# Patient Record
Sex: Female | Born: 2005
Health system: Southern US, Community
[De-identification: ages and names within clinical notes are randomized; demographics above are authoritative.]

## PROBLEM LIST (undated history)

## (undated) ENCOUNTER — Ambulatory Visit

## (undated) DIAGNOSIS — H6690 Otitis media, unspecified, unspecified ear: Secondary | ICD-10-CM

## (undated) HISTORY — PX: WISDOM TOOTH EXTRACTION: SHX21

---
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
## 2023-01-28 DIAGNOSIS — Z3202 Encounter for pregnancy test, result negative: Secondary | ICD-10-CM | POA: Diagnosis not present

## 2023-01-28 DIAGNOSIS — Z114 Encounter for screening for human immunodeficiency virus [HIV]: Secondary | ICD-10-CM | POA: Diagnosis not present

## 2023-01-28 DIAGNOSIS — Z113 Encounter for screening for infections with a predominantly sexual mode of transmission: Secondary | ICD-10-CM | POA: Diagnosis not present

## 2023-03-29 ENCOUNTER — Ambulatory Visit
Admission: EM | Admit: 2023-03-29 | Discharge: 2023-03-29 | Disposition: A | Discharge status: Home / Self Care | Payer: Medicaid Other | Attending: Internal Medicine | Admitting: Internal Medicine

## 2023-03-29 DIAGNOSIS — B349 Viral infection, unspecified: Secondary | ICD-10-CM

## 2023-03-29 DIAGNOSIS — R059 Cough, unspecified: Secondary | ICD-10-CM | POA: Diagnosis present

## 2023-03-29 DIAGNOSIS — Z1152 Encounter for screening for COVID-19: Secondary | ICD-10-CM | POA: Insufficient documentation

## 2023-03-29 MED ORDER — PSEUDOEPHEDRINE HCL 60 MG PO TABS: 60.0000 mg | ORAL_TABLET | Freq: Three times a day (TID) | ORAL | 0 refills | Status: DC | PRN

## 2023-03-29 MED ORDER — PROMETHAZINE-DM 6.25-15 MG/5ML PO SYRP: 5.0000 mL | ORAL_SOLUTION | Freq: Three times a day (TID) | ORAL | 0 refills | Status: DC | PRN

## 2023-03-29 MED ORDER — CETIRIZINE HCL 10 MG PO TABS: 10.0000 mg | ORAL_TABLET | Freq: Every day | ORAL | 0 refills | Status: DC

## 2023-03-29 MED ORDER — IBUPROFEN 800 MG PO TABS: 800.0000 mg | ORAL_TABLET | Freq: Three times a day (TID) | ORAL | 0 refills | Status: DC

## 2023-03-29 NOTE — ED Triage Notes (Signed)
Pt reports dry cough, chest congestion, chest pain when coughing,on and off right lower quadrant abdominal pain since last night. Tylenol and Delsym gives some relief.

## 2023-03-29 NOTE — ED Provider Notes (Signed)
[facility]  Note:  This document was prepared using Conservation officer, historic buildings and may include unintentional dictation errors.  MRN: [id number] DOB: 12/15/2005  Subjective:   Morgan Mcguire is a 17 y.o. female presenting for 1 day history of acute onset persistent dry cough, chest congestion and chest pain when she coughs.  She did start having right-sided flank pain with her coughing as well.  Has had some drainage but not overt throat pain.  Has been using Tylenol and Delsym with some relief.  Patient's mother would like a COVID test.  No history of asthma.  No smoking of any kind including cigarettes, cigars, vaping, marijuana use.    No current facility-administered medications for this encounter.  Current Outpatient Medications:    acetaminophen (TYLENOL) 500 MG tablet, Take 500 mg by mouth every 6 (six) hours as needed., Disp: , Rfl:    Dextromethorphan HBr (DELSYM PO), Take by mouth., Disp: , Rfl:    fluconazole (DIFLUCAN) 150 MG tablet, Take 1 tablet (150 mg total) by mouth daily., Disp: 1 tablet, Rfl: 0   No Known Allergies  Past Medical History:  Diagnosis Date   Acute ear infection      Past Surgical History:  Procedure Laterality Date   WISDOM TOOTH EXTRACTION      History reviewed. No pertinent family history.  Social History   Tobacco Use   Smoking status: Never   Smokeless tobacco: Never  Vaping Use   Vaping status: Never Used  Substance Use Topics   Alcohol use: Never   Drug use: Never    ROS   Objective:   Vitals: BP 126/81 (BP Location: Right Arm)   Pulse 85   Temp 98.3 F (36.8 C) (Oral)   Resp 16   LMP  (Within Weeks) Comment: 1 week  SpO2 95%   Physical Exam Constitutional:      General: She is not in acute distress.    Appearance: Normal appearance. She is well-developed. She is not ill-appearing, toxic-appearing or diaphoretic.  HENT:     Head: Normocephalic and atraumatic.     Nose: Nose normal.      Mouth/Throat:     Mouth: Mucous membranes are moist.     Pharynx: No pharyngeal swelling, oropharyngeal exudate, posterior oropharyngeal erythema or uvula swelling.     Tonsils: No tonsillar exudate or tonsillar abscesses. 0 on the right. 0 on the left.  Eyes:     General: No scleral icterus.       Right eye: No discharge.        Left eye: No discharge.     Extraocular Movements: Extraocular movements intact.  Cardiovascular:     Rate and Rhythm: Normal rate and regular rhythm.     Heart sounds: Normal heart sounds. No murmur heard.    No friction rub. No gallop.  Pulmonary:     Effort: Pulmonary effort is normal. No respiratory distress.     Breath sounds: No stridor. No wheezing, rhonchi or rales.  Chest:     Chest wall: No tenderness.  Skin:    General: Skin is warm and dry.  Neurological:     General: No focal deficit present.     Mental Status: She is alert and oriented to person, place, and time.  Psychiatric:        Mood and Affect: Mood normal.        Behavior: Behavior normal.     Assessment and Plan :  PDMP not reviewed this encounter.  1. Acute viral syndrome    Deferred imaging given clear cardiopulmonary exam, hemodynamically stable vital signs. Does not meet [facility] criteria for strep testing.  Will manage for viral illness such as viral URI, viral syndrome, viral rhinitis, COVID-19. Recommended supportive care. Offered scripts for symptomatic relief. Testing is pending. Counseled patient on potential for adverse effects with medications prescribed/recommended today, ER and return-to-clinic precautions discussed, patient verbalized understanding.     Wallis Bamberg, [place] 03/29/23 1310

## 2023-03-29 NOTE — Discharge Instructions (Addendum)
We will notify you of your test results as they arrive and may take between about 24 hours.  I encourage you to sign up for MyChart if you have not already done so as this can be the easiest way for [place] to communicate results to you online or through a phone app.  Generally, we only contact you if it is a positive test result.  In the meantime, if you develop worsening symptoms including fever, chest pain, shortness of breath despite our current treatment plan then please report to the emergency room as this may be a sign of worsening status from possible viral infection.  Otherwise, we will manage this as a viral syndrome. For sore throat or cough try using a honey-based tea. Use 3 teaspoons of honey with juice squeezed from half lemon. Place shaved pieces of ginger into 1/2-1 cup of water and warm over stove top. Then mix the ingredients and repeat every 4 hours as needed. Please take ibuprofen '600mg'$  every 6 hours for aches and pains, fevers. Hydrate very well with at least 2 liters of water. Eat light meals such as soups to replenish electrolytes and soft fruits, veggies. Start an antihistamine like Zyrtec ('10mg'$  daily) for postnasal drainage, sinus congestion.  You can take this together with pseudoephedrine (Sudafed) at a dose of 60 mg 2-3 times a day as needed for the same kind of congestion.  Use the cough medications as needed.

## 2023-03-30 ENCOUNTER — Telehealth: Payer: Self-pay | Admitting: Urgent Care

## 2023-03-30 LAB — SARS CORONAVIRUS 2 (TAT 6-24 HRS): SARS Coronavirus 2: NEGATIVE

## 2023-03-30 NOTE — Telephone Encounter (Signed)
During the phone encounter with patient, patient's mother has concerns about the ongoing illness. Recommended the emergency room or [facility] where she can see another provider/physician. However, I am also willing to see her again in clinic. Will hold off on sending in a new prescription as patient's mother plans on bringing her back in.

## 2023-03-30 NOTE — Telephone Encounter (Signed)
Patient's mother called with concerns about cough syrup making patient drowsy. Will send in prescription for benzonatate capsules instead.

## 2023-04-18 ENCOUNTER — Ambulatory Visit (HOSPITAL_COMMUNITY)
Admission: EM | Admit: 2023-04-18 | Discharge: 2023-04-18 | Disposition: A | Discharge status: Home / Self Care | Payer: BC Managed Care – PPO | Attending: Internal Medicine | Admitting: Internal Medicine

## 2023-04-18 ENCOUNTER — Encounter (HOSPITAL_COMMUNITY): Payer: Self-pay | Admitting: Emergency Medicine

## 2023-04-18 DIAGNOSIS — Z9189 Other specified personal risk factors, not elsewhere classified: Secondary | ICD-10-CM | POA: Insufficient documentation

## 2023-04-18 DIAGNOSIS — N760 Acute vaginitis: Secondary | ICD-10-CM | POA: Insufficient documentation

## 2023-04-18 DIAGNOSIS — Z3202 Encounter for pregnancy test, result negative: Secondary | ICD-10-CM | POA: Diagnosis not present

## 2023-04-18 LAB — POCT URINE PREGNANCY: Preg Test, Ur: NEGATIVE

## 2023-04-18 MED ORDER — FLUCONAZOLE 150 MG PO TABS: 150.0000 mg | ORAL_TABLET | ORAL | 0 refills | Status: DC

## 2023-04-18 NOTE — ED Triage Notes (Signed)
Pt reports vaginal discomfort and discharge for a couple days. Requesting STD testing. Denies any known exposure.

## 2023-04-18 NOTE — Discharge Instructions (Addendum)
I suspect that your  symptoms are due to suspected yeast vaginitis.  Take medication as prescribed.  Your swab has been sent for testing, staff will call you in 2-3 days if you test positive for any other infections and will provide treatment at that time.  Return to urgent care as needed and follow-up with your primary care provider for further evaluation and management of your symptoms..  I hope you feel better!

## 2023-04-18 NOTE — ED Provider Notes (Signed)
[facility]    CSN: [id number] Arrival date & time: 04/18/23  1621      History   Chief Complaint Chief Complaint  Patient presents with   Vaginal Discharge    HPI Jemmah Vanwyhe is a 17 y.o. female.   Carlin Madero is a 17 y.o. female presenting for chief complaint of vaginal discharge that started a couple of days ago.  Vaginal discharge is both sick and then, stringy, white, and without odor.  She reports significant vaginal itching and irritation as well.  Recent new unprotected sexual partner, would like STD testing as well. No recent known exposures to STD, nausea, vomiting, diarrhea, abdominal pain, pelvic pain, urinary symptoms, rash, fever/chills, or recent antibiotic/steroid use.  Has not attempted treatment of symptoms PTA.  Last menstrual cycle April 02, 2023.   Vaginal Discharge   Past Medical History:  Diagnosis Date   Acute ear infection     There are no problems to display for this patient.   Past Surgical History:  Procedure Laterality Date   WISDOM TOOTH EXTRACTION      OB History   No obstetric history on file.      Home Medications    Prior to Admission medications   Medication Sig Start Date End Date Taking? Authorizing Provider  fluconazole (DIFLUCAN) 150 MG tablet Take 1 tablet (150 mg total) by mouth every 3 (three) days. 04/18/23  Yes Carlisle Beers, FNP  acetaminophen (TYLENOL) 500 MG tablet Take 500 mg by mouth every 6 (six) hours as needed.    [provider]  cetirizine (ZYRTEC ALLERGY) 10 MG tablet Take 1 tablet (10 mg total) by mouth daily. 03/29/23   Wallis Bamberg, PA-C  Dextromethorphan HBr (DELSYM PO) Take by mouth.    [provider]  ibuprofen (ADVIL) 800 MG tablet Take 1 tablet (800 mg total) by mouth 3 (three) times daily. 03/29/23   Wallis Bamberg, PA-C    Family History No family history on file.  Social History Social History   Tobacco Use   Smoking status: Never   Smokeless tobacco:  Never  Vaping Use   Vaping status: Never Used  Substance Use Topics   Alcohol use: Never   Drug use: Never     Allergies   Patient has no known allergies.   Review of Systems Review of Systems  Genitourinary:  Positive for vaginal discharge.  Per HPI   Physical Exam Triage Vital Signs ED Triage Vitals  Encounter Vitals Group     BP 04/18/23 1640 124/81     Systolic BP Percentile --      Diastolic BP Percentile --      Pulse Rate 04/18/23 1640 87     Resp 04/18/23 1640 15     Temp 04/18/23 1640 98.7 F (37.1 C)     Temp Source 04/18/23 1640 Oral     SpO2 04/18/23 1640 100 %     Weight --      Height --      Head Circumference --      Peak Flow --      Pain Score 04/18/23 1639 7     Pain Loc --      Pain Education --      Exclude from Growth Chart --    No data found.  Updated Vital Signs BP 124/81 (BP Location: Left Arm)   Pulse 87   Temp 98.7 F (37.1 C) (Oral)   Resp 15   LMP 04/02/2023 (  Exact Date)   SpO2 100%   Visual Acuity Right Eye Distance:   Left Eye Distance:   Bilateral Distance:    Right Eye Near:   Left Eye Near:    Bilateral Near:     Physical Exam Vitals and nursing note reviewed.  Constitutional:      Appearance: She is not ill-appearing or toxic-appearing.  HENT:     Head: Normocephalic and atraumatic.     Right Ear: Hearing and external ear normal.     Left Ear: Hearing and external ear normal.     Nose: Nose normal.     Mouth/Throat:     Lips: Pink.     Mouth: Mucous membranes are moist. No injury.     Tongue: No lesions. Tongue does not deviate from midline.     Palate: No mass and lesions.     Pharynx: Oropharynx is clear. Uvula midline. No pharyngeal swelling, oropharyngeal exudate, posterior oropharyngeal erythema or uvula swelling.     Tonsils: No tonsillar exudate or tonsillar abscesses.  Eyes:     General: Lids are normal. Vision grossly intact. Gaze aligned appropriately.     Extraocular Movements: Extraocular  movements intact.     Conjunctiva/sclera: Conjunctivae normal.  Pulmonary:     Effort: Pulmonary effort is normal.  Genitourinary:    Comments: Deferred. Musculoskeletal:     Cervical back: Neck supple.     Right lower leg: No edema.     Left lower leg: No edema.  Skin:    General: Skin is warm and dry.     Capillary Refill: Capillary refill takes less than 2 seconds.     Findings: No rash.  Neurological:     General: No focal deficit present.     Mental Status: She is alert and oriented to person, place, and time. Mental status is at baseline.     Cranial Nerves: No dysarthria or facial asymmetry.  Psychiatric:        Mood and Affect: Mood normal.        Speech: Speech normal.        Behavior: Behavior normal.        Thought Content: Thought content normal.        Judgment: Judgment normal.      UC Treatments / Results  Labs (all labs ordered are listed, but only abnormal results are displayed) Labs Reviewed  POCT URINE PREGNANCY  CERVICOVAGINAL ANCILLARY ONLY    EKG   Radiology No results found.  Procedures Procedures (including critical care time)  Medications Ordered in UC Medications - No data to display  Initial Impression / Assessment and Plan / UC Course  I have reviewed the triage vital signs and the nursing notes.  Pertinent labs & imaging results that were available during my care of the patient were reviewed by me and considered in my medical decision making (see chart for details).   1. Acute vaginitis, unprotected sex, negative pregnancy test STI labs pending, will notify patient of positive results and treat accordingly per protocol when labs result.  Patient declines HIV and syphilis testing today.   Patient to avoid sexual intercourse until screening testing comes back.   Education provided regarding safe sexual practices and patient encouraged to use protection to prevent spread of STIs.  Urine pregnancy test negative.  Counseled patient  on potential for adverse effects with medications prescribed/recommended today, strict ER and return-to-clinic precautions discussed, patient verbalized understanding.    Final Clinical Impressions(s) / UC Diagnoses  Final diagnoses:  Acute vaginitis  At risk for sexually transmitted infection due to unprotected sex  Negative pregnancy test     Discharge Instructions      I suspect that your  symptoms are due to suspected yeast vaginitis.  Take medication as prescribed.  Your swab has been sent for testing, staff will call you in 2-3 days if you test positive for any other infections and will provide treatment at that time.  Return to urgent care as needed and follow-up with your primary care provider for further evaluation and management of your symptoms..  I hope you feel better!       ED Prescriptions     Medication Sig Dispense Auth. Provider   fluconazole (DIFLUCAN) 150 MG tablet Take 1 tablet (150 mg total) by mouth every 3 (three) days. 2 tablet Carlisle Beers, FNP      PDMP not reviewed this encounter.   Carlisle Beers, [place] 04/18/23 1722

## 2023-04-19 LAB — CERVICOVAGINAL ANCILLARY ONLY
Bacterial Vaginitis (gardnerella): NEGATIVE
Candida Glabrata: NEGATIVE
Candida Vaginitis: POSITIVE — AB
Chlamydia: NEGATIVE
Comment: NEGATIVE
Comment: NEGATIVE
Comment: NEGATIVE
Comment: NEGATIVE
Comment: NEGATIVE
Comment: NORMAL
Neisseria Gonorrhea: NEGATIVE
Trichomonas: NEGATIVE

## 2023-04-20 ENCOUNTER — Telehealth: Payer: Self-pay | Admitting: Emergency Medicine

## 2023-04-20 NOTE — Telephone Encounter (Signed)
Opened in error

## 2023-05-15 ENCOUNTER — Ambulatory Visit
Admission: RE | Admit: 2023-05-15 | Discharge: 2023-05-15 | Disposition: A | Discharge status: Home / Self Care | Payer: BC Managed Care – PPO | Source: Ambulatory Visit | Attending: Internal Medicine

## 2023-05-15 VITALS — BP 129/82 | HR 64 | Temp 97.6°F | Resp 16

## 2023-05-15 DIAGNOSIS — N760 Acute vaginitis: Secondary | ICD-10-CM | POA: Diagnosis not present

## 2023-05-15 MED ORDER — FLUCONAZOLE 150 MG PO TABS: 150.0000 mg | ORAL_TABLET | ORAL | 0 refills | Status: DC

## 2023-05-15 NOTE — ED Provider Notes (Signed)
  [facility]  Note:  This document was prepared using Conservation officer, historic buildings and may include unintentional dictation errors.  MRN: [id number] DOB: 02-25-2006  Subjective:   Morgan Mcguire is a 17 y.o. female presenting for 3-day history of acute onset recurrent vaginal itching, irritation and discharge.  Symptoms started after she had her menstrual cycle.  Has a history of yeast infections.  Last episode was a month ago.  Would like an STI check as well as BV and yeast infection check.  Does not have a gynecologist.  No current facility-administered medications for this encounter.  Current Outpatient Medications:    acetaminophen (TYLENOL) 500 MG tablet, Take 500 mg by mouth every 6 (six) hours as needed., Disp: , Rfl:    cetirizine (ZYRTEC ALLERGY) 10 MG tablet, Take 1 tablet (10 mg total) by mouth daily., Disp: 30 tablet, Rfl: 0   Dextromethorphan HBr (DELSYM PO), Take by mouth., Disp: , Rfl:    fluconazole (DIFLUCAN) 150 MG tablet, Take 1 tablet (150 mg total) by mouth every 3 (three) days., Disp: 2 tablet, Rfl: 0   ibuprofen (ADVIL) 800 MG tablet, Take 1 tablet (800 mg total) by mouth 3 (three) times daily., Disp: 21 tablet, Rfl: 0   No Known Allergies  Past Medical History:  Diagnosis Date   Acute ear infection      Past Surgical History:  Procedure Laterality Date   WISDOM TOOTH EXTRACTION      No family history on file.  Social History   Tobacco Use   Smoking status: Never   Smokeless tobacco: Never  Vaping Use   Vaping status: Never Used  Substance Use Topics   Alcohol use: Never   Drug use: Never    ROS   Objective:   Vitals: BP 129/82 (BP Location: Right Arm)   Pulse 64   Temp 97.6 F (36.4 C) (Oral)   Resp 16   LMP 05/12/2023 (Exact Date)   SpO2 99%   Physical Exam Constitutional:      General: She is not in acute distress.    Appearance: Normal appearance. She is well-developed. She is not ill-appearing,  toxic-appearing or diaphoretic.  HENT:     Head: Normocephalic and atraumatic.     Nose: Nose normal.     Mouth/Throat:     Mouth: Mucous membranes are moist.  Eyes:     General: No scleral icterus.       Right eye: No discharge.        Left eye: No discharge.     Extraocular Movements: Extraocular movements intact.  Cardiovascular:     Rate and Rhythm: Normal rate.  Pulmonary:     Effort: Pulmonary effort is normal.  Skin:    General: Skin is warm and dry.  Neurological:     General: No focal deficit present.     Mental Status: She is alert and oriented to person, place, and time.  Psychiatric:        Mood and Affect: Mood normal.        Behavior: Behavior normal.     Assessment and Plan :   PDMP not reviewed this encounter.  1. Acute vaginitis    We will treat patient empirically for yeast vaginitis with fluconazole.  Labs pending. Counseled patient on potential for adverse effects with medications prescribed/recommended today, ER and return-to-clinic precautions discussed, patient verbalized understanding.    Wallis Bamberg, PA-C 05/15/23 1128

## 2023-05-15 NOTE — ED Triage Notes (Signed)
Pt c/o vaginal itching x 3 days-sx started after menstrual cycle start-NAD-steady gait

## 2023-05-17 LAB — CERVICOVAGINAL ANCILLARY ONLY
Bacterial Vaginitis (gardnerella): NEGATIVE
Candida Glabrata: NEGATIVE
Candida Vaginitis: POSITIVE — AB
Chlamydia: NEGATIVE
Comment: NEGATIVE
Comment: NEGATIVE
Comment: NEGATIVE
Comment: NEGATIVE
Comment: NEGATIVE
Comment: NORMAL
Neisseria Gonorrhea: NEGATIVE
Trichomonas: NEGATIVE

## 2023-05-18 DIAGNOSIS — N760 Acute vaginitis: Secondary | ICD-10-CM | POA: Diagnosis not present

## 2023-05-18 DIAGNOSIS — N898 Other specified noninflammatory disorders of vagina: Secondary | ICD-10-CM | POA: Diagnosis not present

## 2023-06-03 ENCOUNTER — Ambulatory Visit: Payer: BC Managed Care – PPO | Admitting: Family Medicine

## 2023-06-03 ENCOUNTER — Encounter: Payer: Self-pay | Admitting: Family Medicine

## 2023-06-03 VITALS — BP 120/80 | HR 61 | Temp 97.5°F | Resp 16 | Ht 68.5 in | Wt 183.4 lb

## 2023-06-03 DIAGNOSIS — R1013 Epigastric pain: Secondary | ICD-10-CM | POA: Diagnosis not present

## 2023-06-03 LAB — POC URINALSYSI DIPSTICK (AUTOMATED)
Bilirubin, UA: NEGATIVE
Blood, UA: NEGATIVE
Glucose, UA: NEGATIVE
Leukocytes, UA: NEGATIVE
Nitrite, UA: NEGATIVE
Protein, UA: NEGATIVE
Spec Grav, UA: >=1.03 — AB (ref 1.010–1.025)
Urobilinogen, UA: 0.2 U/dL
pH, UA: 6 (ref 5.0–8.0)

## 2023-06-03 LAB — POCT URINE PREGNANCY: Preg Test, Ur: NEGATIVE

## 2023-06-03 MED ORDER — OMEPRAZOLE 40 MG PO CPDR: 40.0000 mg | DELAYED_RELEASE_CAPSULE | Freq: Every day | ORAL | 3 refills | Status: DC

## 2023-06-03 NOTE — Patient Instructions (Signed)
Abdominal Pain, Adult  Pain in the abdomen (abdominal pain) can be caused by many things. In most cases, it gets better with no treatment or by being treated at home. But in some cases, it can be serious. Your health care provider will ask questions about your medical history and do a physical exam to try to figure out what is causing your pain. Follow these instructions at home: Medicines Take over-the-counter and prescription medicines only as told by your provider. Do not take medicines that help you poop (laxatives) unless told by your provider. General instructions Watch your condition for any changes. Drink enough fluid to keep your pee (urine) pale yellow. Contact a health care provider if: Your pain changes, gets worse, or lasts longer than expected. You have severe cramping or bloating in your abdomen, or you vomit. Your pain gets worse with meals, after eating, or with certain foods. You are constipated or have diarrhea for more than 2-3 days. You are not hungry, or you lose weight without trying. You have signs of dehydration. These may include: Dark pee, very little pee, or no pee. Cracked lips or dry mouth. Sleepiness or weakness. You have pain when you pee (urinate) or poop. Your abdominal pain wakes you up at night. You have blood in your pee. You have a fever. Get help right away if: You cannot stop vomiting. Your pain is only in one part of the abdomen. Pain on the right side could be caused by appendicitis. You have bloody or black poop (stool), or poop that looks like tar. You have trouble breathing. You have chest pain. These symptoms may be an emergency. Get help right away. Call 911. Do not wait to see if the symptoms will go away. Do not drive yourself to the hospital. This information is not intended to replace advice given to you by your health care provider. Make sure you discuss any questions you have with your health care provider. Document Revised:  05/06/2022 Document Reviewed: 05/06/2022 Elsevier Patient Education  2024 [employer]  

## 2023-06-03 NOTE — Progress Notes (Signed)
Established Patient Office Visit  Subjective   Patient ID: Morgan Mcguire, female    DOB: 05-08-2006  Age: 17 y.o. MRN: [id number]  Chief Complaint  Patient presents with   Establish Care    HPI   .Discussed the use of AI scribe software for clinical note transcription with the patient, who gave verbal consent to proceed.  History of Present Illness   The patient, a young adult, presents for establishing care after aging out of pediatric services. She reports a history of stomach pain, described as a constant feeling in the upper abdomen, which is triggered by certain foods. The patient is unable to identify a specific food group that triggers the pain, but notes that it could be fried foods or even mac and cheese. The pain has been ongoing for a while and appears to be worsening. She has been taking omeprazole, prescribed by her workplace, an urgent care center, which provides some relief.  In addition, the patient has a history of recurrent yeast infections, which seem to coincide with her menstrual cycle. She has been to the emergency room multiple times for this issue, and all tests for STIs and STDs have returned negative. The patient has been prescribed Diflucan and metronidazole for these infections. She is currently under the care of an OBGYN for this issue.  The patient is sexually active with one female partner and uses condoms for birth control. She denies any use of alcohol, drugs, or tobacco. 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       Patient Active Problem List   Diagnosis Date Noted   Epigastric pain 06/03/2023   Past Medical History:  Diagnosis Date   Acute ear infection    Past Surgical History:  Procedure Laterality Date   WISDOM TOOTH EXTRACTION     Social History   Tobacco Use   Smoking status: Never   Smokeless tobacco: Never  Vaping Use   Vaping status: Never Used   Substance Use Topics   Alcohol use: Never   Drug use: Never   Social History   Socioeconomic History   Marital status: Single    Spouse name: Not on file   Number of children: Not on file   Years of education: Not on file   Highest education level: Not on file  Occupational History   Occupation: front desk reception    Comment: [employer]   Occupation: student a and [employer]  Tobacco Use   Smoking status: Never   Smokeless tobacco: Never  Vaping Use   Vaping status: Never Used  Substance and Sexual Activity   Alcohol use: Never   Drug use: Never   Sexual activity: Yes    Partners: Male    Birth control/protection: None, Condom  Other Topics Concern   Not on file  Social History Narrative   Lives at home with mom   No pets    House    Social Determinants of Health   Financial Resource Strain: Not on file  Food Insecurity: Not on file  Transportation Needs: Not on file  Physical Activity: Not on file  Stress: Not on file  Social Connections: Not on file  Intimate Partner Violence: Not on file   Family Status  Relation Name Status   Mother  Alive   Father  Alive   Sister  Alive   Brother  Alive  No partnership data  on file   No family history on file. No Known Allergies    Review of Systems  Constitutional:  Negative for fever and malaise/fatigue.  HENT:  Negative for congestion.   Eyes:  Negative for blurred vision.  Respiratory:  Negative for shortness of breath.   Cardiovascular:  Negative for chest pain, palpitations and leg swelling.  Gastrointestinal:  Positive for abdominal pain. Negative for blood in stool, constipation, diarrhea, nausea and vomiting.  Genitourinary:  Negative for dysuria and frequency.  Musculoskeletal:  Negative for falls.  Skin:  Negative for rash.  Neurological:  Negative for dizziness, loss of consciousness and headaches.  Endo/Heme/Allergies:  Negative for environmental allergies.  Psychiatric/Behavioral:  Negative for  depression. The patient is not nervous/anxious.       Objective:     BP 120/80 (BP Location: Left Arm, Patient Position: Sitting, Cuff Size: Normal)   Pulse 61   Temp (!) 97.5 F (36.4 C) (Oral)   Resp 16   Ht 5' 8.5" (1.74 m)   Wt 183 lb 6.4 oz (83.2 kg)   LMP 05/12/2023 (Exact Date)   SpO2 100%   BMI 27.48 kg/m  BP Readings from Last 3 Encounters:  06/03/23 120/80  05/15/23 129/82  04/18/23 124/81   Wt Readings from Last 3 Encounters:  06/03/23 183 lb 6.4 oz (83.2 kg) (95%, Z= 1.69)*  11/19/22 213 lb (96.6 kg) (98%, Z= 2.12)*  05/18/22 200 lb (90.7 kg) (98%, Z= 1.98)*   * Growth percentiles are based on CDC (Girls, 2-20 Years) data.   SpO2 Readings from Last 3 Encounters:  06/03/23 100%  05/15/23 99%  04/18/23 100%      Physical Exam Vitals and nursing note reviewed.  Constitutional:      General: She is not in acute distress.    Appearance: Normal appearance. She is well-developed.  HENT:     Head: Normocephalic and atraumatic.     Right Ear: Tympanic membrane, ear canal and external ear normal. There is no impacted cerumen.     Left Ear: Tympanic membrane, ear canal and external ear normal. There is no impacted cerumen.     Nose: Nose normal.     Mouth/Throat:     Mouth: Mucous membranes are moist.     Pharynx: Oropharynx is clear. No oropharyngeal exudate or posterior oropharyngeal erythema.  Eyes:     General: No scleral icterus.       Right eye: No discharge.        Left eye: No discharge.     Conjunctiva/sclera: Conjunctivae normal.     Pupils: Pupils are equal, round, and reactive to light.  Neck:     Thyroid: No thyromegaly or thyroid tenderness.     Vascular: No JVD.  Cardiovascular:     Rate and Rhythm: Normal rate and regular rhythm.     Heart sounds: Normal heart sounds. No murmur heard. Pulmonary:     Effort: Pulmonary effort is normal. No respiratory distress.     Breath sounds: Normal breath sounds.  Abdominal:     General: Bowel  sounds are normal. There is no distension.     Palpations: Abdomen is soft. There is no mass.     Tenderness: There is abdominal tenderness. There is no guarding or rebound.       Comments: Min tenderness mid epigastric and periumbilical with palpation No rebound/ guarding   Genitourinary:    Vagina: Normal.  Musculoskeletal:        General: Normal range of  motion.     Cervical back: Normal range of motion and neck supple.     Right lower leg: No edema.     Left lower leg: No edema.  Lymphadenopathy:     Cervical: No cervical adenopathy.  Skin:    General: Skin is warm and dry.     Findings: No erythema or rash.  Neurological:     Mental Status: She is alert and oriented to person, place, and time.     Cranial Nerves: No cranial nerve deficit.     Deep Tendon Reflexes: Reflexes are normal and symmetric.  Psychiatric:        Mood and Affect: Mood normal.        Behavior: Behavior normal.        Thought Content: Thought content normal.        Judgment: Judgment normal.      Results for orders placed or performed in visit on 06/03/23  POCT Urinalysis Dipstick (Automated)  Result Value Ref Range   Color, UA yellow    Clarity, UA clear    Glucose, UA Negative Negative   Bilirubin, UA neg    Ketones, UA trace    Spec Grav, UA >=1.030 (A) 1.010 - 1.025   Blood, UA neg    pH, UA 6.0 5.0 - 8.0   Protein, UA Negative Negative   Urobilinogen, UA 0.2 0.2 or 1.0 E.U./dL   Nitrite, UA neg    Leukocytes, UA Negative Negative  POCT urine pregnancy  Result Value Ref Range   Preg Test, Ur Negative Negative    Last CBC No results found for: "WBC", "HGB", "HCT", "MCV", "MCH", "RDW", "PLT" Last metabolic panel No results found for: "GLUCOSE", "NA", "K", "CL", "CO2", "BUN", "CREATININE", "EGFR", "CALCIUM", "PHOS", "PROT", "ALBUMIN", "LABGLOB", "AGRATIO", "BILITOT", "ALKPHOS", "AST", "ALT", "ANIONGAP" Last lipids No results found for: "CHOL", "HDL", "LDLCALC", "LDLDIRECT", "TRIG",  "CHOLHDL" Last hemoglobin A1c No results found for: "HGBA1C" Last thyroid functions No results found for: "TSH", "T3TOTAL", "T4TOTAL", "THYROIDAB" Last vitamin D No results found for: "25OHVITD2", "25OHVITD3", "VD25OH" Last vitamin B12 and Folate No results found for: "VITAMINB12", "FOLATE"    The ASCVD Risk score (Arnett DK, et al., 2019) failed to calculate for the following reasons:   The 2019 ASCVD risk score is only valid for ages 30 to 36    Assessment & Plan:   Problem List Items Addressed This Visit       Unprioritized   Epigastric pain - Primary   Relevant Medications   omeprazole (PRILOSEC) 40 MG capsule   Other Relevant Orders   Comprehensive metabolic panel   CBC with Differential/Platelet   POCT Urinalysis Dipstick (Automated) (Completed)   POCT urine pregnancy (Completed)   US Abdomen Complete  Assessment and Plan    Abdominal Pain Epigastric pain after eating certain foods, possibly related to fatty or fried foods. No associated diarrhea or constipation. Omeprazole provides some relief. -Continue Omeprazole daily for 6-8 weeks. -Order abdominal ultrasound to evaluate gallbladder and other abdominal structures. -If symptoms persist after stopping Omeprazole, refer to gastroenterologist.  Recurrent Yeast Infections Occurring around menstrual cycle, confirmed by cultures. No other STIs identified. -Managed by OBGYN at [facility].  General Health Maintenance -Declined flu shot at this visit.        Return if symptoms worsen or fail to improve.    Donato Schultz, DO

## 2023-06-04 ENCOUNTER — Telehealth (HOSPITAL_BASED_OUTPATIENT_CLINIC_OR_DEPARTMENT_OTHER): Payer: Self-pay

## 2023-06-04 LAB — COMPREHENSIVE METABOLIC PANEL
ALT: 11 U/L (ref 0–35)
AST: 12 U/L (ref 0–37)
Albumin: 3.9 g/dL (ref 3.5–5.2)
Alkaline Phosphatase: 53 U/L (ref 47–119)
BUN: 9 mg/dL (ref 6–23)
CO2: 27 meq/L (ref 19–32)
Calcium: 9.1 mg/dL (ref 8.4–10.5)
Chloride: 104 meq/L (ref 96–112)
Creatinine, Ser: 0.84 mg/dL (ref 0.40–1.20)
GFR: 100.91 mL/min (ref >60.00)
Glucose, Bld: 74 mg/dL (ref 70–99)
Potassium: 4 meq/L (ref 3.5–5.1)
Sodium: 139 meq/L (ref 135–145)
Total Bilirubin: 0.3 mg/dL (ref 0.2–1.2)
Total Protein: 6.5 g/dL (ref 6.0–8.3)

## 2023-06-04 LAB — CBC WITH DIFFERENTIAL/PLATELET
Basophils Absolute: 0.1 10*3/uL (ref 0.0–0.1)
Basophils Relative: 0.8 % (ref 0.0–3.0)
Eosinophils Absolute: 0.1 10*3/uL (ref 0.0–0.7)
Eosinophils Relative: 1.5 % (ref 0.0–5.0)
HCT: 38.3 % (ref 36.0–49.0)
Hemoglobin: 12 g/dL (ref 12.0–16.0)
Lymphocytes Relative: 32.8 % (ref 24.0–48.0)
Lymphs Abs: 2.7 10*3/uL (ref 0.7–4.0)
MCHC: 31.3 g/dL (ref 31.0–37.0)
MCV: 83.3 fL (ref 78.0–98.0)
Monocytes Absolute: 0.9 10*3/uL (ref 0.1–1.0)
Monocytes Relative: 10.9 % (ref 3.0–12.0)
Neutro Abs: 4.4 10*3/uL (ref 1.4–7.7)
Neutrophils Relative %: 54 % (ref 43.0–71.0)
Platelets: 112 10*3/uL — ABNORMAL LOW (ref 150.0–575.0)
RBC: 4.6 Mil/uL (ref 3.80–5.70)
RDW: 14.9 % (ref 11.4–15.5)
WBC: 8.2 10*3/uL (ref 4.5–13.5)

## 2023-06-06 ENCOUNTER — Other Ambulatory Visit: Payer: Self-pay | Admitting: Family Medicine

## 2023-06-06 DIAGNOSIS — D691 Qualitative platelet defects: Secondary | ICD-10-CM

## 2023-06-07 ENCOUNTER — Other Ambulatory Visit: Payer: BC Managed Care – PPO

## 2023-06-07 ENCOUNTER — Other Ambulatory Visit (INDEPENDENT_AMBULATORY_CARE_PROVIDER_SITE_OTHER): Payer: BC Managed Care – PPO

## 2023-06-07 ENCOUNTER — Telehealth: Payer: Self-pay | Admitting: Family Medicine

## 2023-06-07 ENCOUNTER — Ambulatory Visit (HOSPITAL_BASED_OUTPATIENT_CLINIC_OR_DEPARTMENT_OTHER)
Admission: RE | Admit: 2023-06-07 | Discharge: 2023-06-07 | Disposition: A | Discharge status: Home / Self Care | Payer: BC Managed Care – PPO | Source: Ambulatory Visit | Attending: Family Medicine | Admitting: Family Medicine

## 2023-06-07 DIAGNOSIS — R10816 Epigastric abdominal tenderness: Secondary | ICD-10-CM | POA: Diagnosis not present

## 2023-06-07 DIAGNOSIS — R1013 Epigastric pain: Secondary | ICD-10-CM | POA: Diagnosis not present

## 2023-06-07 DIAGNOSIS — D691 Qualitative platelet defects: Secondary | ICD-10-CM | POA: Diagnosis not present

## 2023-06-07 NOTE — Addendum Note (Signed)
Addended by: Thelma Barge D on: 06/07/2023 02:49 PM   Modules accepted: Orders

## 2023-06-07 NOTE — Telephone Encounter (Signed)
Pt's mom called regarding her lab notes. States they read on mychart her platelets were low and noted pt has also been experiencing stomach pain and wondered if this is correlated. She would like to know what to going forward. Pt is scheduled to repeat labs tomorrow but her mother is requesting imaging to be done. Please follow up with pt.

## 2023-06-08 ENCOUNTER — Other Ambulatory Visit: Payer: Self-pay | Admitting: Family Medicine

## 2023-06-08 ENCOUNTER — Other Ambulatory Visit: Payer: BC Managed Care – PPO

## 2023-06-08 DIAGNOSIS — R1013 Epigastric pain: Secondary | ICD-10-CM

## 2023-06-08 LAB — COMPREHENSIVE METABOLIC PANEL
ALT: 9 U/L (ref 0–35)
AST: 9 U/L (ref 0–37)
Albumin: 3.7 g/dL (ref 3.5–5.2)
Alkaline Phosphatase: 43 U/L — ABNORMAL LOW (ref 47–119)
BUN: 5 mg/dL — ABNORMAL LOW (ref 6–23)
CO2: 27 meq/L (ref 19–32)
Calcium: 9.2 mg/dL (ref 8.4–10.5)
Chloride: 106 meq/L (ref 96–112)
Creatinine, Ser: 0.84 mg/dL (ref 0.40–1.20)
GFR: 100.9 mL/min (ref >60.00)
Glucose, Bld: 87 mg/dL (ref 70–99)
Potassium: 4.4 meq/L (ref 3.5–5.1)
Sodium: 138 meq/L (ref 135–145)
Total Bilirubin: 0.5 mg/dL (ref 0.2–1.2)
Total Protein: 6.2 g/dL (ref 6.0–8.3)

## 2023-06-08 LAB — CBC WITH DIFFERENTIAL/PLATELET
Basophils Absolute: 0.1 10*3/uL (ref 0.0–0.1)
Basophils Relative: 0.8 % (ref 0.0–3.0)
Eosinophils Absolute: 0.1 10*3/uL (ref 0.0–0.7)
Eosinophils Relative: 2.1 % (ref 0.0–5.0)
HCT: 37.7 % (ref 36.0–49.0)
Hemoglobin: 11.9 g/dL — ABNORMAL LOW (ref 12.0–16.0)
Lymphocytes Relative: 35.4 % (ref 24.0–48.0)
Lymphs Abs: 2.2 10*3/uL (ref 0.7–4.0)
MCHC: 31.5 g/dL (ref 31.0–37.0)
MCV: 83.5 fL (ref 78.0–98.0)
Monocytes Absolute: 0.6 10*3/uL (ref 0.1–1.0)
Monocytes Relative: 8.9 % (ref 3.0–12.0)
Neutro Abs: 3.3 10*3/uL (ref 1.4–7.7)
Neutrophils Relative %: 52.8 % (ref 43.0–71.0)
Platelets: 150 10*3/uL (ref 150.0–575.0)
RBC: 4.52 Mil/uL (ref 3.80–5.70)
RDW: 15.2 % (ref 11.4–15.5)
WBC: 6.3 10*3/uL (ref 4.5–13.5)

## 2023-06-08 NOTE — Telephone Encounter (Signed)
Noted. Imaging has not been read yet

## 2023-07-12 ENCOUNTER — Other Ambulatory Visit: Payer: Self-pay

## 2023-07-12 ENCOUNTER — Encounter (HOSPITAL_BASED_OUTPATIENT_CLINIC_OR_DEPARTMENT_OTHER): Payer: Self-pay

## 2023-07-12 ENCOUNTER — Emergency Department (HOSPITAL_BASED_OUTPATIENT_CLINIC_OR_DEPARTMENT_OTHER)
Admission: EM | Admit: 2023-07-12 | Discharge: 2023-07-13 | Disposition: A | Discharge status: Home / Self Care | Payer: BC Managed Care – PPO | Attending: Emergency Medicine | Admitting: Emergency Medicine

## 2023-07-12 ENCOUNTER — Emergency Department (HOSPITAL_BASED_OUTPATIENT_CLINIC_OR_DEPARTMENT_OTHER): Payer: BC Managed Care – PPO

## 2023-07-12 DIAGNOSIS — R0789 Other chest pain: Secondary | ICD-10-CM | POA: Diagnosis not present

## 2023-07-12 DIAGNOSIS — B349 Viral infection, unspecified: Secondary | ICD-10-CM | POA: Diagnosis not present

## 2023-07-12 DIAGNOSIS — Z20822 Contact with and (suspected) exposure to covid-19: Secondary | ICD-10-CM | POA: Diagnosis not present

## 2023-07-12 DIAGNOSIS — R059 Cough, unspecified: Secondary | ICD-10-CM | POA: Diagnosis not present

## 2023-07-12 DIAGNOSIS — R079 Chest pain, unspecified: Secondary | ICD-10-CM | POA: Diagnosis not present

## 2023-07-12 LAB — BASIC METABOLIC PANEL
Anion gap: 7 (ref 5–15)
BUN: 11 mg/dL (ref 6–20)
CO2: 24 mmol/L (ref 22–32)
Calcium: 9.3 mg/dL (ref 8.9–10.3)
Chloride: 105 mmol/L (ref 98–111)
Creatinine, Ser: 1 mg/dL (ref 0.44–1.00)
GFR, Estimated: >60 mL/min (ref >60)
Glucose, Bld: 93 mg/dL (ref 70–99)
Potassium: 3.4 mmol/L — ABNORMAL LOW (ref 3.5–5.1)
Sodium: 136 mmol/L (ref 135–145)

## 2023-07-12 LAB — CBC
HCT: 39.3 % (ref 36.0–46.0)
Hemoglobin: 12.7 g/dL (ref 12.0–15.0)
MCH: 26.5 pg (ref 26.0–34.0)
MCHC: 32.3 g/dL (ref 30.0–36.0)
MCV: 81.9 fL (ref 80.0–100.0)
Platelets: 119 10*3/uL — ABNORMAL LOW (ref 150–400)
RBC: 4.8 MIL/uL (ref 3.87–5.11)
RDW: 14.1 % (ref 11.5–15.5)
WBC: 5.7 10*3/uL (ref 4.0–10.5)
nRBC: 0 % (ref 0.0–0.2)

## 2023-07-12 LAB — TROPONIN I (HIGH SENSITIVITY): Troponin I (High Sensitivity): <2 ng/L (ref <18)

## 2023-07-12 NOTE — ED Triage Notes (Signed)
Pt arrives with c/o chest tightness, cough, body aches, and headaches. Pts coworker did test positive for covid. Pt denies fevers.

## 2023-07-13 ENCOUNTER — Other Ambulatory Visit: Payer: Self-pay

## 2023-07-13 DIAGNOSIS — B349 Viral infection, unspecified: Secondary | ICD-10-CM | POA: Diagnosis not present

## 2023-07-13 LAB — RESP PANEL BY RT-PCR (RSV, FLU A&B, COVID)  RVPGX2
Influenza A by PCR: NEGATIVE
Influenza B by PCR: NEGATIVE
Resp Syncytial Virus by PCR: NEGATIVE
SARS Coronavirus 2 by RT PCR: NEGATIVE

## 2023-07-13 MED ORDER — ACETAMINOPHEN 500 MG PO TABS
1000.0000 mg | ORAL_TABLET | Freq: Once | ORAL | Status: AC
  Administered 2023-07-13: 1000 mg via ORAL
  Filled 2023-07-13: qty 2

## 2023-07-13 NOTE — ED Notes (Signed)
Pt and mother aware we still need urine

## 2023-07-13 NOTE — ED Notes (Signed)
Provider at bedside

## 2023-07-13 NOTE — ED Provider Notes (Signed)
[place] [facility] Provider Note  CSN: [id number] Arrival date & time: 07/12/23 2241  Chief Complaint(s) Fatigue, myalgia, and chest discomfort  HPI Morgan Mcguire is a 17 y.o. female    The history is provided by the patient.  Influenza Presenting symptoms: cough, fatigue, headache, myalgias and rhinorrhea   Presenting symptoms: no shortness of breath and no vomiting   Severity:  Moderate Onset quality:  Gradual Duration:  2 days Progression:  Worsening Chronicity:  New Worsened by:  Nothing Ineffective treatments:  None tried Associated symptoms: chills and nasal congestion   Associated symptoms: no neck stiffness   Risk factors: sick contacts    Patient reported anterior upper chest discomfort today. Worse with coughing.  Past Medical History Past Medical History:  Diagnosis Date   Acute ear infection    Patient Active Problem List   Diagnosis Date Noted   Epigastric pain 06/03/2023   Home Medication(s) Prior to Admission medications   Medication Sig Start Date End Date Taking? Authorizing Provider  acetaminophen (TYLENOL) 500 MG tablet Take 500 mg by mouth every 6 (six) hours as needed.    [provider]  cetirizine (ZYRTEC ALLERGY) 10 MG tablet Take 1 tablet (10 mg total) by mouth daily. 03/29/23   Wallis Bamberg, PA-C  ibuprofen (ADVIL) 800 MG tablet Take 1 tablet (800 mg total) by mouth 3 (three) times daily. 03/29/23   Wallis Bamberg, PA-C  omeprazole (PRILOSEC) 40 MG capsule Take 1 capsule (40 mg total) by mouth daily. 06/03/23   Donato Schultz, DO                                                                                                                                    Allergies Patient has no known allergies.  Review of Systems Review of Systems  Constitutional:  Positive for chills and fatigue.  HENT:  Positive for congestion and rhinorrhea.   Respiratory:  Positive for cough. Negative for shortness  of breath.   Gastrointestinal:  Negative for vomiting.  Musculoskeletal:  Positive for myalgias. Negative for neck stiffness.  Neurological:  Positive for headaches.   As noted in HPI  Physical Exam Vital Signs  I have reviewed the triage vital signs BP (!) 143/99   Pulse 71   Temp 98.9 F (37.2 C)   Resp 18   Ht 5\' 8"  (1.727 m)   Wt 81.6 kg   LMP 07/05/2023   SpO2 99%   BMI 27.37 kg/m   Physical Exam Vitals reviewed.  Constitutional:      General: She is not in acute distress.    Appearance: She is well-developed. She is not diaphoretic.  HENT:     Head: Normocephalic and atraumatic.     Right Ear: Tympanic membrane normal.     Left Ear: Tympanic membrane normal.     Nose: Congestion present.     Mouth/Throat:  Tongue: No lesions.     Pharynx: Postnasal drip present. No posterior oropharyngeal erythema.     Tonsils: No tonsillar exudate.  Eyes:     General: No scleral icterus.       Right eye: No discharge.        Left eye: No discharge.     Conjunctiva/sclera: Conjunctivae normal.     Pupils: Pupils are equal, round, and reactive to light.  Cardiovascular:     Rate and Rhythm: Normal rate and regular rhythm.     Heart sounds: No murmur heard.    No friction rub. No gallop.  Pulmonary:     Effort: Pulmonary effort is normal. No respiratory distress.     Breath sounds: Normal breath sounds. No stridor. No rales.  Chest:     Chest wall: Tenderness present.    Abdominal:     General: There is no distension.     Palpations: Abdomen is soft.     Tenderness: There is no abdominal tenderness.  Musculoskeletal:        General: No tenderness.     Cervical back: Normal range of motion and neck supple.  Skin:    General: Skin is warm and dry.     Findings: No erythema or rash.  Neurological:     Mental Status: She is alert and oriented to person, place, and time.     ED Results and Treatments Labs (all labs ordered are listed, but only abnormal results  are displayed) Labs Reviewed  BASIC METABOLIC PANEL - Abnormal; Notable for the following components:      Result Value   Potassium 3.4 (*)    All other components within normal limits  CBC - Abnormal; Notable for the following components:   Platelets 119 (*)    All other components within normal limits  RESP PANEL BY RT-PCR (RSV, FLU A&B, COVID)  RVPGX2  PREGNANCY, URINE  TROPONIN I (HIGH SENSITIVITY)  TROPONIN I (HIGH SENSITIVITY)                                                                                                                         EKG   Radiology DG Chest 2 View  Result Date: 07/12/2023 CLINICAL DATA:  Chest pain. EXAM: CHEST - 2 VIEW COMPARISON:  Chest radiograph dated 09/20/2006. FINDINGS: The heart size and mediastinal contours are within normal limits. Both lungs are clear. The visualized skeletal structures are unremarkable. IMPRESSION: No active cardiopulmonary disease. Electronically Signed   By: Elgie Collard M.D.   On: 07/12/2023 23:43    Medications Ordered in ED Medications  acetaminophen (TYLENOL) tablet 1,000 mg (1,000 mg Oral Given 07/13/23 0125)   Procedures Procedures  (including critical care time) Medical Decision Making / ED Course   Medical Decision Making Amount and/or Complexity of Data Reviewed Labs: ordered. Decision-making details documented in ED Course. Radiology: ordered and independent interpretation performed. Decision-making details documented in ED Course. ECG/medicine tests: ordered and independent interpretation performed. Decision-making  details documented in ED Course.  Risk OTC drugs.    Patient presents with viral symptoms for 2 days. Adequate oral hydration. Rest of history as above.  Patient appears well. No signs of toxicity, patient is interactive. No hypoxia, tachypnea or other signs of respiratory distress. No sign of clinical dehydration. Lung exam Clear. Rest of exam as above.  Chest discomfort most  suspicious for chest wall pain related to coughing.  Chest x-ray without evidence of pneumonia, pneumothorax, pulmonary edema or pleural effusions.  EKG without acute ischemic changes or evidence of pericarditis.  Troponin negative.  Doubt ACS requiring additional cardiac markers.  Doubt PE.  CBC without leukocytosis or anemia.  Metabolic panel without significant electrolyte derangements or renal sufficiency.  Viral panel negative for COVID, influenza, RSV.  Most consistent with viral illness.  No evidence suggestive of pharyngitis, AOM, PNA, or meningitis.  Discussed symptomatic treatment with the patient and they will follow closely with their PCP.      Final Clinical Impression(s) / ED Diagnoses Final diagnoses:  Viral illness   The patient appears reasonably screened and/or stabilized for discharge and I doubt any other medical condition or other [facility] requiring further screening, evaluation, or treatment in the ED at this time. I have discussed the findings, Dx and Tx plan with the patient/family who expressed understanding and agree(s) with the plan. Discharge instructions discussed at length. The patient/family was given strict return precautions who verbalized understanding of the instructions. No further questions at time of discharge.  Disposition: Discharge  Condition: Good  ED Discharge Orders     None       Follow Up: Donato Schultz, DO [address] [place] [address] [phone]  Call  to schedule an appointment for close follow up    This chart was dictated using voice recognition software.  Despite best efforts to proofread,  errors can occur which can change the documentation meaning.    Nira Conn, MD 07/13/23 [phone]

## 2023-07-13 NOTE — Discharge Instructions (Signed)
You may take over-the-counter medicine for symptomatic relief, such as Tylenol, Motrin, TheraFlu, Alka seltzer , black elderberry, etc. Please limit acetaminophen (Tylenol) to 4000 mg and Ibuprofen (Motrin, Advil, etc.) to 2400 mg for a 24hr period. Please note that other over-the-counter medicine may contain acetaminophen or ibuprofen as a component of their ingredients.   

## 2023-08-02 ENCOUNTER — Telehealth: Payer: Self-pay

## 2023-08-02 NOTE — Telephone Encounter (Signed)
Copied from CRM [id number]. Topic: Clinical - Medical Advice >> Aug 02, 2023  3:59 PM Chantha C wrote: Reason for CRM: Pt c/o possible for Yeast infection and would like an rx sent to [id number] - [place], [place] - [address] [place] [address] Phone:[phone]:[phone]  Pt cannot wait for OV 08/05/23, pls advise and c/b [phone].

## 2023-08-03 ENCOUNTER — Other Ambulatory Visit: Payer: Self-pay | Admitting: Family Medicine

## 2023-08-03 MED ORDER — FLUCONAZOLE 150 MG PO TABS: 150.0000 mg | ORAL_TABLET | Freq: Every day | ORAL | 0 refills | Status: DC

## 2023-08-03 NOTE — Telephone Encounter (Signed)
Pt called. LVM advising medication sent

## 2023-08-03 NOTE — Telephone Encounter (Signed)
Pt has an appointment on 08/05/23. Please advise

## 2023-08-05 ENCOUNTER — Ambulatory Visit: Payer: BC Managed Care – PPO | Admitting: Family Medicine

## 2023-08-24 ENCOUNTER — Ambulatory Visit (INDEPENDENT_AMBULATORY_CARE_PROVIDER_SITE_OTHER): Payer: BC Managed Care – PPO | Admitting: Physician Assistant

## 2023-08-24 ENCOUNTER — Ambulatory Visit: Payer: BC Managed Care – PPO | Admitting: Family Medicine

## 2023-08-24 ENCOUNTER — Encounter: Payer: Self-pay | Admitting: Physician Assistant

## 2023-08-24 ENCOUNTER — Other Ambulatory Visit (HOSPITAL_COMMUNITY)
Admission: RE | Admit: 2023-08-24 | Discharge: 2023-08-24 | Disposition: A | Discharge status: Home / Self Care | Payer: BC Managed Care – PPO | Source: Ambulatory Visit | Attending: Physician Assistant | Admitting: Physician Assistant

## 2023-08-24 VITALS — BP 119/81 | HR 54 | Temp 97.7°F | Ht 68.0 in | Wt 180.0 lb

## 2023-08-24 DIAGNOSIS — R350 Frequency of micturition: Secondary | ICD-10-CM

## 2023-08-24 DIAGNOSIS — N3001 Acute cystitis with hematuria: Secondary | ICD-10-CM | POA: Diagnosis not present

## 2023-08-24 LAB — POCT URINALYSIS DIP (MANUAL ENTRY)
Bilirubin, UA: NEGATIVE
Glucose, UA: NEGATIVE mg/dL
Ketones, POC UA: NEGATIVE mg/dL
Nitrite, UA: NEGATIVE
Spec Grav, UA: 1.015 (ref 1.010–1.025)
Urobilinogen, UA: 0.2 U/dL
pH, UA: 6 (ref 5.0–8.0)

## 2023-08-24 MED ORDER — NITROFURANTOIN MONOHYD MACRO 100 MG PO CAPS: 100.0000 mg | ORAL_CAPSULE | Freq: Two times a day (BID) | ORAL | 0 refills | Status: AC

## 2023-08-24 NOTE — Progress Notes (Signed)
Established patient visit   Patient: Morgan Mcguire   DOB: 19-Jun-2006   18 y.o. Female  MRN: [id number] Visit Date: 08/24/2023  Today's healthcare provider: Alfredia Ferguson, PA-C   Chief Complaint  Patient presents with   Urinary Tract Infection    Having to use bathroom a lot- no burning while peeing and no pain    Subjective    Pt reports during intercourse 2 days ago, she had some fecal contamination.  Today, reports increased urinary frequency, but denies dysuria, abdominal pain, back pain, hematuria, no vaginal discharge.   Medications: Outpatient Medications Prior to Visit  Medication Sig   acetaminophen (TYLENOL) 500 MG tablet Take 500 mg by mouth every 6 (six) hours as needed.   cetirizine (ZYRTEC ALLERGY) 10 MG tablet Take 1 tablet (10 mg total) by mouth daily.   fluconazole (DIFLUCAN) 150 MG tablet Take 1 tablet (150 mg total) by mouth daily. May repeat in 3 days if needed.   ibuprofen (ADVIL) 800 MG tablet Take 1 tablet (800 mg total) by mouth 3 (three) times daily.   omeprazole (PRILOSEC) 40 MG capsule Take 1 capsule (40 mg total) by mouth daily.   No facility-administered medications prior to visit.    Review of Systems  Constitutional:  Negative for fatigue and fever.  Respiratory:  Negative for cough and shortness of breath.   Cardiovascular:  Negative for chest pain and leg swelling.  Gastrointestinal:  Negative for abdominal pain.  Genitourinary:  Positive for frequency.  Neurological:  Negative for dizziness and headaches.       Objective    BP 119/81   Pulse (!) 54   Temp 97.7 F (36.5 C) (Oral)   Ht 5\' 8"  (1.727 m)   Wt 180 lb (81.6 kg)   LMP 08/21/2023 (Approximate)   SpO2 100%   BMI 27.37 kg/m    Physical Exam Constitutional:      General: She is awake.     Appearance: She is well-developed.  HENT:     Head: Normocephalic.  Eyes:     Conjunctiva/sclera: Conjunctivae normal.  Cardiovascular:     Rate and Rhythm: Normal rate  and regular rhythm.     Heart sounds: Normal heart sounds.  Pulmonary:     Effort: Pulmonary effort is normal.     Breath sounds: Normal breath sounds.  Skin:    General: Skin is warm.  Neurological:     Mental Status: She is alert and oriented to person, place, and time.  Psychiatric:        Attention and Perception: Attention normal.        Mood and Affect: Mood normal.        Speech: Speech normal.        Behavior: Behavior is cooperative.      Results for orders placed or performed in visit on 08/24/23  POCT urinalysis dipstick  Result Value Ref Range   Color, UA yellow yellow   Clarity, UA cloudy (A) clear   Glucose, UA negative negative mg/dL   Bilirubin, UA negative negative   Ketones, POC UA negative negative mg/dL   Spec Grav, UA 1.914 7.829 - 1.025   Blood, UA large (A) negative   pH, UA 6.0 5.0 - 8.0   Protein Ur, POC trace (A) negative mg/dL   Urobilinogen, UA 0.2 0.2 or 1.0 E.U./dL   Nitrite, UA Negative Negative   Leukocytes, UA Large (3+) (A) Negative    Assessment & Plan  Urinary frequency -     POCT urinalysis dipstick -     Cervicovaginal ancillary only  Acute cystitis with hematuria -     Nitrofurantoin Monohyd Macro; Take 1 capsule (100 mg total) by mouth 2 (two) times daily for 5 days.  Dispense: 10 capsule; Refill: 0 -     Urine Culture   UA with large leuks and blood, tx for cystitis with macrobid Pt comfortable w/ self swab today, r/o sti, bv/yeast Ordering urine culture  Return if symptoms worsen or fail to improve.       Alfredia Ferguson, PA-C  [facility] at [facility] [phone] (phone) [phone] (fax)  [facility]

## 2023-08-25 ENCOUNTER — Encounter: Payer: Self-pay | Admitting: Physician Assistant

## 2023-08-25 LAB — CERVICOVAGINAL ANCILLARY ONLY
Bacterial Vaginitis (gardnerella): NEGATIVE
Candida Glabrata: NEGATIVE
Candida Vaginitis: NEGATIVE
Chlamydia: NEGATIVE
Comment: NEGATIVE
Comment: NEGATIVE
Comment: NEGATIVE
Comment: NEGATIVE
Comment: NEGATIVE
Comment: NORMAL
Neisseria Gonorrhea: NEGATIVE
Trichomonas: NEGATIVE

## 2023-08-25 LAB — URINE CULTURE
MICRO NUMBER:: 15981784
Result:: NO GROWTH
SPECIMEN QUALITY:: ADEQUATE

## 2023-08-26 ENCOUNTER — Other Ambulatory Visit: Payer: Self-pay | Admitting: Physician Assistant

## 2023-08-26 DIAGNOSIS — N3001 Acute cystitis with hematuria: Secondary | ICD-10-CM

## 2023-08-26 NOTE — Telephone Encounter (Signed)
Lab scheduled for tomorrow 1/24

## 2023-08-27 ENCOUNTER — Other Ambulatory Visit: Payer: BC Managed Care – PPO

## 2023-08-30 DIAGNOSIS — N760 Acute vaginitis: Secondary | ICD-10-CM | POA: Diagnosis not present

## 2023-08-31 ENCOUNTER — Other Ambulatory Visit: Payer: BC Managed Care – PPO

## 2023-09-08 ENCOUNTER — Encounter: Payer: Self-pay | Admitting: Gastroenterology

## 2023-11-11 ENCOUNTER — Encounter: Payer: Self-pay | Admitting: Gastroenterology

## 2023-11-11 ENCOUNTER — Other Ambulatory Visit

## 2023-11-11 ENCOUNTER — Ambulatory Visit: Payer: BC Managed Care – PPO | Admitting: Gastroenterology

## 2023-11-11 VITALS — BP 122/62 | HR 82 | Ht 68.0 in | Wt 181.0 lb

## 2023-11-11 DIAGNOSIS — R1013 Epigastric pain: Secondary | ICD-10-CM

## 2023-11-11 DIAGNOSIS — D696 Thrombocytopenia, unspecified: Secondary | ICD-10-CM

## 2023-11-11 LAB — COMPREHENSIVE METABOLIC PANEL WITH GFR
ALT: 23 U/L (ref 0–35)
AST: 31 U/L (ref 0–37)
Albumin: 4.5 g/dL (ref 3.5–5.2)
Alkaline Phosphatase: 48 U/L (ref 47–119)
BUN: 13 mg/dL (ref 6–23)
CO2: 25 meq/L (ref 19–32)
Calcium: 9.4 mg/dL (ref 8.4–10.5)
Chloride: 104 meq/L (ref 96–112)
Creatinine, Ser: 0.92 mg/dL (ref 0.40–1.20)
GFR: 90.19 mL/min (ref >60.00)
Glucose, Bld: 96 mg/dL (ref 70–99)
Potassium: 4.2 meq/L (ref 3.5–5.1)
Sodium: 137 meq/L (ref 135–145)
Total Bilirubin: 0.3 mg/dL (ref 0.2–1.2)
Total Protein: 7.6 g/dL (ref 6.0–8.3)

## 2023-11-11 LAB — CBC WITH DIFFERENTIAL/PLATELET
Basophils Absolute: 0 10*3/uL (ref 0.0–0.1)
Basophils Relative: 0.4 % (ref 0.0–3.0)
Eosinophils Absolute: 0.1 10*3/uL (ref 0.0–0.7)
Eosinophils Relative: 1.9 % (ref 0.0–5.0)
HCT: 40.2 % (ref 36.0–49.0)
Hemoglobin: 12.9 g/dL (ref 12.0–16.0)
Lymphocytes Relative: 30.9 % (ref 24.0–48.0)
Lymphs Abs: 1.7 10*3/uL (ref 0.7–4.0)
MCHC: 32.1 g/dL (ref 31.0–37.0)
MCV: 82.6 fl (ref 78.0–98.0)
Monocytes Absolute: 0.4 10*3/uL (ref 0.1–1.0)
Monocytes Relative: 7.9 % (ref 3.0–12.0)
Neutro Abs: 3.3 10*3/uL (ref 1.4–7.7)
Neutrophils Relative %: 58.9 % (ref 43.0–71.0)
Platelets: 203 10*3/uL (ref 150.0–575.0)
RBC: 4.87 Mil/uL (ref 3.80–5.70)
RDW: 14.1 % (ref 11.4–15.5)
WBC: 5.5 10*3/uL (ref 4.5–13.5)

## 2023-11-11 LAB — TSH: TSH: 1.33 u[IU]/mL (ref 0.40–5.00)

## 2023-11-11 LAB — LIPASE: Lipase: 19 U/L (ref 11.0–59.0)

## 2023-11-11 MED ORDER — PANTOPRAZOLE SODIUM 20 MG PO TBEC
20.0000 mg | DELAYED_RELEASE_TABLET | Freq: Every day | ORAL | 3 refills | Status: DC
Start: 2023-11-11 — End: 2024-03-03

## 2023-11-11 NOTE — Progress Notes (Signed)
 Chief Complaint: Abdo pain.  Referring Provider:  Zola Button, Grayling Congress, *      ASSESSMENT AND PLAN;   #1. Epi pain. Neg [place] 06/2023 #2. Low plts (?ITP)   Plan: -Change omeprazole to protonix  20mg  po every day -CBC, CMP, celiac, lipase, TSH, food allergy testing -If plts still low, hematology consultation. -EGD in 3-4 weeks. -If still with problems and above WU is neg, would proceed with CT scan abdo/pelvis followed by HIDA with EF. -FU in 12 weeks    HPI:    Morgan Mcguire is a 18 y.o. female  Accompanied by her mother. Pt works with Dr. Manson Passey  History of Present Illness Morgan Mcguire is a 18 year old female who presents with abdominal pain triggered by certain foods. She is accompanied by her mother. She was referred by Dr. Manson Passey for further evaluation of her symptoms.  For about a year, she has experienced abdominal pain primarily triggered by certain foods. The pain is described as an aching sensation located above the umbilicus, lasting one to two hours postprandially. Foods that exacerbate the pain include [employer] sandwiches, lamb chops, and Chipotle meals.  She has been evaluated by her primary care physician and was prescribed omeprazole, which provides occasional relief. An abdominal ultrasound was performed, showing normal results for the liver, pancreas, kidneys, and bile duct.  No changes in bowel movements, such as diarrhea or constipation. No issues with heartburn, vomiting, or regurgitation. She denies any specific issues with dairy products, as she can consume cereal without problems. However, she sometimes experiences bloating after eating pasta.  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 There is no known family history of food allergies. She does not take any regular medications aside from occasional headache remedies. In the past, her platelets were noted to be  low, but they have since normalized. She reports normal menstrual cycles without heavy bleeding or clots.    Past Medical History:  Diagnosis Date   Acute ear infection     Past Surgical History:  Procedure Laterality Date   WISDOM TOOTH EXTRACTION      Family History  Problem Relation Age of Onset   Liver disease Neg Hx    Esophageal cancer Neg Hx    Colon cancer Neg Hx     Social History   Tobacco Use   Smoking status: Never   Smokeless tobacco: Never  Vaping Use   Vaping status: Never Used  Substance Use Topics   Alcohol use: Never   Drug use: Never    Current Outpatient Medications  Medication Sig Dispense Refill   acetaminophen (TYLENOL) 500 MG tablet Take 500 mg by mouth every 6 (six) hours as needed.     cetirizine (ZYRTEC ALLERGY) 10 MG tablet Take 1 tablet (10 mg total) by mouth daily. 30 tablet 0   fluconazole (DIFLUCAN) 150 MG tablet Take 1 tablet (150 mg total) by mouth daily. May repeat in 3 days if needed. 2 tablet 0   ibuprofen (ADVIL) 800 MG tablet Take 1 tablet (800 mg total) by mouth 3 (three) times daily. 21 tablet 0   omeprazole (PRILOSEC) 40 MG capsule Take 1 capsule (40 mg total) by mouth daily. 90 capsule 3   No current facility-administered medications for this visit.    No Known Allergies  Review of Systems:  Constitutional: Denies fever, chills, diaphoresis, appetite change and fatigue.  HEENT: Denies  photophobia, eye pain, redness, hearing loss, ear pain, congestion, sore throat, rhinorrhea, sneezing, mouth sores, neck pain, neck stiffness and tinnitus.   Respiratory: Denies SOB, DOE, cough, chest tightness,  and wheezing.   Cardiovascular: Denies chest pain, palpitations and leg swelling.  Genitourinary: Denies dysuria, urgency, frequency, hematuria, flank pain and difficulty urinating.  Musculoskeletal: Denies myalgias, back pain, joint swelling, arthralgias and gait problem.  Skin: No rash.  Neurological: Denies dizziness, seizures,  syncope, weakness, light-headedness, numbness and headaches.  Hematological: Denies adenopathy. Easy bruising, personal or family bleeding history  Psychiatric/Behavioral: No anxiety or depression     Physical Exam:    BP 122/62   Pulse 82   Ht 5\' 8"  (1.727 m)   Wt 181 lb (82.1 kg)   BMI 27.52 kg/m  Wt Readings from Last 3 Encounters:  11/11/23 181 lb (82.1 kg) (95%, Z= 1.62)*  08/24/23 180 lb (81.6 kg) (95%, Z= 1.61)*  07/12/23 180 lb (81.6 kg) (95%, Z= 1.62)*   * Growth percentiles are based on CDC (Girls, 2-20 Years) data.   Constitutional:  Well-developed, in no acute distress. Psychiatric: Normal mood and affect. Behavior is normal. HEENT: Pupils normal.  Conjunctivae are normal. No scleral icterus. Neck supple.  Cardiovascular: Normal rate, regular rhythm. No edema Pulmonary/chest: Effort normal and breath sounds normal. No wheezing, rales or rhonchi. Abdominal: Soft, nondistended. Nontender. Bowel sounds active throughout. There are no masses palpable. No hepatomegaly. Rectal: Deferred Neurological: Alert and oriented to person place and time. Skin: Skin is warm and dry. No rashes noted.  Data Reviewed: I have personally reviewed following labs and imaging studies  CBC:    Latest Ref Rng & Units 07/12/2023   10:59 PM 06/07/2023    2:49 PM 06/03/2023    3:48 PM  CBC  WBC 4.0 - 10.5 K/uL 5.7  6.3  8.2   Hemoglobin 12.0 - 15.0 g/dL 16.1  09.6  04.5   Hematocrit 36.0 - 46.0 % 39.3  37.7  38.3   Platelets 150 - 400 K/uL 119  150.0  112.0     CMP:    Latest Ref Rng & Units 07/12/2023   10:59 PM 06/07/2023    2:37 PM 06/03/2023    3:48 PM  CMP  Glucose 70 - 99 mg/dL 93  87  74   BUN 6 - 20 mg/dL 11  5  9    Creatinine 0.44 - 1.00 mg/dL 4.09  8.11  9.14   Sodium 135 - 145 mmol/L 136  138  139   Potassium 3.5 - 5.1 mmol/L 3.4  4.4  4.0   Chloride 98 - 111 mmol/L 105  106  104   CO2 22 - 32 mmol/L 24  27  27    Calcium 8.9 - 10.3 mg/dL 9.3  9.2  9.1   Total  Protein 6.0 - 8.3 g/dL  6.2  6.5   Total Bilirubin 0.2 - 1.2 mg/dL  0.5  0.3   Alkaline Phos 47 - 119 U/L  43  53   AST 0 - 37 U/L  9  12   ALT 0 - 35 U/L  9  11       Edman Circle, MD 11/11/2023, 10:41 AM  Cc: Zola Button, Grayling Congress, *

## 2023-11-11 NOTE — Addendum Note (Signed)
 Addended by: Alberteen Sam E on: 11/11/2023 11:48 AM   Modules accepted: Orders

## 2023-11-11 NOTE — Patient Instructions (Addendum)
 _______________________________________________________  If your blood pressure at your visit was 140/90 or greater, please contact your primary care physician to follow up on this.  _______________________________________________________  If you are age 20 or older, your body mass index should be between 23-30. Your Body mass index is 27.52 kg/m. If this is out of the aforementioned range listed, please consider follow up with your Primary Care Provider.  If you are age 14 or younger, your body mass index should be between 19-25. Your Body mass index is 27.52 kg/m. If this is out of the aformentioned range listed, please consider follow up with your Primary Care Provider.   ________________________________________________________  The [facility] providers would like to encourage you to use [facility] to communicate with providers for non-urgent requests or questions.  Due to long hold times on the telephone, sending your provider a message by [facility] may be a faster and more efficient way to get a response.  Please allow 48 business hours for a response.  Please remember that this is for non-urgent requests.  _______________________________________________________  Your provider has requested that you go to the basement level for lab work before leaving today. Press "B" on the elevator. The lab is located at the first door on the left as you exit the elevator.  We have sent the following medications to your pharmacy for you to pick up at your convenience: Protonix 20mg  daily  You have been scheduled for an appointment with Dr. Chales Abrahams on 02-16-24 at 1040am . Please arrive 10 minutes early for your appointment.  You have been scheduled for an endoscopy. Please follow written instructions given to you at your visit today.  If you use inhalers (even only as needed), please bring them with you on the day of your procedure.  If you take any of the following medications, they will need to be  adjusted prior to your procedure:   DO NOT TAKE 7 DAYS PRIOR TO TEST- Trulicity (dulaglutide) Ozempic, Wegovy (semaglutide) Mounjaro (tirzepatide) Bydureon Bcise (exanatide extended release)  DO NOT TAKE 1 DAY PRIOR TO YOUR TEST Rybelsus (semaglutide) Adlyxin (lixisenatide) Victoza (liraglutide) Byetta (exanatide) ___________________________________________________________________________  Thank you,  Dr. Lynann Bologna

## 2023-11-14 LAB — CELIAC PANEL 10
Antigliadin Abs, IgA: 7 U (ref 0–19)
Endomysial IgA: NEGATIVE
Gliadin IgG: 4 U (ref 0–19)
IgA/Immunoglobulin A, Serum: 188 mg/dL (ref 87–352)
Tissue Transglut Ab: 4 U/mL (ref 0–5)

## 2023-11-15 LAB — FOOD ALLERGY PROFILE
Allergen, Salmon, f41: <0.1 kU/L
Almonds: <0.1 kU/L
Brazil Nut: <0.1 kU/L
CLASS: 0
CLASS: 0
CLASS: 0
CLASS: 0
CLASS: 0
CLASS: 0
CLASS: 0
CLASS: 0
CLASS: 0
CLASS: 0
CLASS: 0
CLASS: 0
Cashew IgE: <0.1 kU/L
Class: 0
Class: 0
Class: 0
Class: 0
Class: 1
Egg White IgE: <0.1 kU/L
Fish Cod: <0.1 kU/L
Hazelnut: <0.1 kU/L
Macadamia Nut: <0.1 kU/L
Milk IgE: 0.35 kU/L — ABNORMAL HIGH
Peanut IgE: <0.1 kU/L
Scallop IgE: <0.1 kU/L
Sesame Seed f10: <0.1 kU/L
Shrimp IgE: <0.1 kU/L
Soybean IgE: <0.1 kU/L
Tuna IgE: <0.1 kU/L
Walnut: <0.1 kU/L
Wheat IgE: <0.1 kU/L

## 2023-11-15 LAB — MILK COMPONENT PANEL RFLX
Allergen, Alpha-lactalb,f76: <0.1 kU/L
Allergen, Beta-lactoglob,f77: 0.13 kU/L — ABNORMAL HIGH
Allergen, Casein, f78: 0.27 kU/L — ABNORMAL HIGH
CLASS: 0

## 2023-11-15 LAB — INTERPRETATION:

## 2023-11-24 DIAGNOSIS — L708 Other acne: Secondary | ICD-10-CM | POA: Diagnosis not present

## 2023-11-24 DIAGNOSIS — L810 Postinflammatory hyperpigmentation: Secondary | ICD-10-CM | POA: Diagnosis not present

## 2023-12-07 ENCOUNTER — Telehealth: Payer: Self-pay

## 2023-12-07 ENCOUNTER — Other Ambulatory Visit (INDEPENDENT_AMBULATORY_CARE_PROVIDER_SITE_OTHER)

## 2023-12-07 DIAGNOSIS — N3001 Acute cystitis with hematuria: Secondary | ICD-10-CM

## 2023-12-07 DIAGNOSIS — Z201 Contact with and (suspected) exposure to tuberculosis: Secondary | ICD-10-CM | POA: Diagnosis not present

## 2023-12-07 NOTE — Telephone Encounter (Signed)
 Copied from CRM [id number]. Topic: Clinical - Request for Lab/Test Order >> Dec 07, 2023  9:24 AM Martinique E wrote: Reason for CRM: Patient called in stating that someone at her employment has TB and she is requesting a TB test to rule out contracting / being exposed to it. Patient is really hoping this order can get put in today so she could have it completed today.

## 2023-12-07 NOTE — Telephone Encounter (Signed)
Labs ordered.  Lab appt scheduled.

## 2023-12-07 NOTE — Addendum Note (Signed)
 Addended by: Ysidro Her on: 12/07/2023 02:18 PM   Modules accepted: Orders

## 2023-12-10 ENCOUNTER — Encounter: Payer: Self-pay | Admitting: Family Medicine

## 2023-12-10 LAB — QUANTIFERON-TB GOLD PLUS
Mitogen-NIL: 6.61 [IU]/mL
NIL: 0.01 [IU]/mL
QuantiFERON-TB Gold Plus: NEGATIVE
TB1-NIL: 0 [IU]/mL
TB2-NIL: 0 [IU]/mL

## 2023-12-22 ENCOUNTER — Telehealth: Payer: Self-pay | Admitting: Gastroenterology

## 2023-12-22 ENCOUNTER — Encounter: Admitting: Gastroenterology

## 2023-12-22 NOTE — Telephone Encounter (Signed)
 Good Morning Dr. Venice Gillis,   We called this patient this morning to see if she could come in early for her procedure.  She stated that she had called on Monday and canceled the procedure.   She has a follow up scheduled would you like us  to keep that on schedule or cancel?

## 2023-12-25 ENCOUNTER — Emergency Department (HOSPITAL_BASED_OUTPATIENT_CLINIC_OR_DEPARTMENT_OTHER)
Admission: EM | Admit: 2023-12-25 | Discharge: 2023-12-25 | Disposition: A | Discharge status: Home / Self Care | Attending: Emergency Medicine | Admitting: Emergency Medicine

## 2023-12-25 ENCOUNTER — Emergency Department (HOSPITAL_BASED_OUTPATIENT_CLINIC_OR_DEPARTMENT_OTHER)

## 2023-12-25 ENCOUNTER — Other Ambulatory Visit: Payer: Self-pay

## 2023-12-25 ENCOUNTER — Encounter (HOSPITAL_BASED_OUTPATIENT_CLINIC_OR_DEPARTMENT_OTHER): Payer: Self-pay

## 2023-12-25 DIAGNOSIS — S8991XA Unspecified injury of right lower leg, initial encounter: Secondary | ICD-10-CM | POA: Insufficient documentation

## 2023-12-25 DIAGNOSIS — S8001XA Contusion of right knee, initial encounter: Secondary | ICD-10-CM | POA: Diagnosis not present

## 2023-12-25 DIAGNOSIS — M25561 Pain in right knee: Secondary | ICD-10-CM | POA: Diagnosis not present

## 2023-12-25 DIAGNOSIS — Y92410 Unspecified street and highway as the place of occurrence of the external cause: Secondary | ICD-10-CM | POA: Insufficient documentation

## 2023-12-25 MED ORDER — IBUPROFEN 600 MG PO TABS: 600.0000 mg | ORAL_TABLET | Freq: Four times a day (QID) | ORAL | 0 refills | Status: DC | PRN

## 2023-12-25 MED ORDER — ACETAMINOPHEN 325 MG PO TABS
650.0000 mg | ORAL_TABLET | Freq: Once | ORAL | Status: AC
  Administered 2023-12-25: 650 mg via ORAL
  Filled 2023-12-25: qty 2
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
## 2023-12-28 ENCOUNTER — Ambulatory Visit (INDEPENDENT_AMBULATORY_CARE_PROVIDER_SITE_OTHER): Admitting: Family

## 2023-12-28 ENCOUNTER — Other Ambulatory Visit: Payer: Self-pay | Admitting: Family

## 2023-12-28 ENCOUNTER — Encounter: Payer: Self-pay | Admitting: Family

## 2023-12-28 VITALS — BP 120/80 | HR 118 | Temp 97.8°F | Ht 68.0 in | Wt 184.0 lb

## 2023-12-28 DIAGNOSIS — M25561 Pain in right knee: Secondary | ICD-10-CM | POA: Diagnosis not present

## 2023-12-28 MED ORDER — MELOXICAM 15 MG PO TABS: 15.0000 mg | ORAL_TABLET | Freq: Every day | ORAL | 0 refills | Status: DC
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
## 2023-12-28 NOTE — Patient Instructions (Signed)
 Please discontinue the Ibuprofen . I want you to take a different medication called Meloxicam . You will only take this once a day. It should help with pain and swelling of the knee. Please continue to ice the knee and elevate it as much as you can. Please try to elevate as well.  I have put in the referral to sports medicine and they will call you to schedule.

## 2024-01-10 NOTE — Progress Notes (Unsigned)
    Ben Jackson D.[facility] [address] [place] [address] Phone: [phone]   Assessment and Plan:     There are no diagnoses linked to this encounter.  ***   Pertinent previous records reviewed include ***    Follow Up: ***     Subjective:   I, Nyaire Denbleyker, am serving as a Neurosurgeon for Doctor Ulysees Gander  Chief Complaint: right knee pain   HPI:   01/11/2024 Patient is 18 year old female with right knee pain. Patient states   Relevant Historical Information: ***  Additional pertinent review of systems negative.   Current Outpatient Medications:    acetaminophen  (TYLENOL ) 500 MG tablet, Take 500 mg by mouth every 6 (six) hours as needed., Disp: , Rfl:    cetirizine  (ZYRTEC  ALLERGY ) 10 MG tablet, Take 1 tablet (10 mg total) by mouth daily., Disp: 30 tablet, Rfl: 0   fluconazole  (DIFLUCAN ) 150 MG tablet, Take 1 tablet (150 mg total) by mouth daily. May repeat in 3 days if needed., Disp: 2 tablet, Rfl: 0   meloxicam  (MOBIC ) 15 MG tablet, Take 1 tablet (15 mg total) by mouth daily., Disp: 30 tablet, Rfl: 0   pantoprazole  (PROTONIX ) 20 MG tablet, Take 1 tablet (20 mg total) by mouth daily., Disp: 90 tablet, Rfl: 3   Objective:     There were no vitals filed for this visit.    There is no height or weight on file to calculate BMI.    Physical Exam:    ***   Electronically signed by:  Marshall Skeeter D.[facility] 7:43 AM 01/10/24

## 2024-01-11 ENCOUNTER — Other Ambulatory Visit: Payer: Self-pay

## 2024-01-11 ENCOUNTER — Ambulatory Visit (INDEPENDENT_AMBULATORY_CARE_PROVIDER_SITE_OTHER): Admitting: Sports Medicine

## 2024-01-11 ENCOUNTER — Ambulatory Visit
Admission: RE | Admit: 2024-01-11 | Discharge: 2024-01-11 | Disposition: A | Discharge status: Home / Self Care | Source: Ambulatory Visit | Attending: Physician Assistant | Admitting: Physician Assistant

## 2024-01-11 ENCOUNTER — Ambulatory Visit: Payer: Self-pay

## 2024-01-11 VITALS — BP 116/80 | HR 98 | Ht 68.0 in | Wt 175.0 lb

## 2024-01-11 VITALS — BP 115/78 | HR 60 | Temp 98.4°F | Resp 18 | Ht 68.0 in | Wt 175.0 lb

## 2024-01-11 DIAGNOSIS — Z113 Encounter for screening for infections with a predominantly sexual mode of transmission: Secondary | ICD-10-CM | POA: Insufficient documentation

## 2024-01-11 DIAGNOSIS — M25561 Pain in right knee: Secondary | ICD-10-CM | POA: Diagnosis not present

## 2024-01-11 MED ORDER — MELOXICAM 15 MG PO TABS: 15.0000 mg | ORAL_TABLET | Freq: Every day | ORAL | 0 refills | Status: DC

## 2024-01-11 NOTE — ED Triage Notes (Signed)
 Pt presents to urgent care for STD testing today. Currently denies any symptoms. Pt is requesting full panel including vaginal swab and blood work.

## 2024-01-11 NOTE — Patient Instructions (Signed)
-   Start meloxicam 15 mg daily x2 weeks.  If still having pain after 2 weeks, complete 3rd-week of NSAID. May use remaining NSAID as needed once daily for pain control.  Do not to use additional over-the-counter NSAIDs (ibuprofen, naproxen, Advil, Aleve, etc.) while taking prescription NSAIDs.  May use Tylenol [phone] mg 2 to 3 times a day for breakthrough pain. Knee HEP  4 week follow up

## 2024-01-11 NOTE — Discharge Instructions (Signed)
 You were seen today for STD screening.  We collected a cervicovaginal swab that we will assess for gonorrhea, chlamydia, trichomonas, bacterial vaginosis, yeast.  If collected blood work that we will assess for HIV and syphilis.  We will keep you updated with these results once they are available.  If any medications are indicated by those test results we will call you and medications will either be sent to the pharmacy on file or you can return to the urgent care for an injection.  It is recommended that you refrain from sexual activity until your test results are negative or until you have completed an appropriate medication regimen as dictated by your test results.  Please use a condom or another barrier method to help prevent STD transmission.  Please make sure that you communicate with your partners regarding your test results should any positive results, about as they will also need to be tested and screened.

## 2024-01-11 NOTE — ED Provider Notes (Signed)
 [facility]    CSN: [id number] Arrival date & time: 01/11/24  0945      History   Chief Complaint Chief Complaint  Patient presents with   SEXUALLY TRANSMITTED DISEASE    Check for STD - Entered by patient    HPI Morgan Mcguire is a 18 y.o. female.   HPI  Pt expresses desire for STD screening today She reports she was recently with a new sexual partner - about 4 days ago  She did not use a condom or protection during this encounter  She denies partner telling her of any STD symptoms   She denies current symptoms today    Past Medical History:  Diagnosis Date   Acute ear infection     Patient Active Problem List   Diagnosis Date Noted   Epigastric pain 06/03/2023    Past Surgical History:  Procedure Laterality Date   WISDOM TOOTH EXTRACTION      OB History   No obstetric history on file.      Home Medications    Prior to Admission medications   Medication Sig Start Date End Date Taking? Authorizing Provider  acetaminophen  (TYLENOL ) 500 MG tablet Take 500 mg by mouth every 6 (six) hours as needed.    [provider]  cetirizine  (ZYRTEC  ALLERGY ) 10 MG tablet Take 1 tablet (10 mg total) by mouth daily. 03/29/23   Adolph Hoop, PA-C  fluconazole  (DIFLUCAN ) 150 MG tablet Take 1 tablet (150 mg total) by mouth daily. May repeat in 3 days if needed. 08/03/23   Estill Hemming, DO  meloxicam  (MOBIC ) 15 MG tablet Take 1 tablet (15 mg total) by mouth daily. 12/28/23   Adra Alanis, FNP  pantoprazole  (PROTONIX ) 20 MG tablet Take 1 tablet (20 mg total) by mouth daily. 11/11/23   Lajuan Pila, MD    Family History Family History  Problem Relation Age of Onset   Liver disease Neg Hx    Esophageal cancer Neg Hx    Colon cancer Neg Hx     Social History Social History   Tobacco Use   Smoking status: Never   Smokeless tobacco: Never  Vaping Use   Vaping status: Never Used  Substance Use Topics   Alcohol use: Never   Drug  use: Never     Allergies   Patient has no known allergies.   Review of Systems Review of Systems  Constitutional:  Negative for chills and fever.  Gastrointestinal:  Negative for abdominal pain.  Genitourinary:  Negative for dysuria, genital sores, vaginal bleeding, vaginal discharge and vaginal pain.  Skin:  Negative for rash.     Physical Exam Triage Vital Signs ED Triage Vitals  Encounter Vitals Group     BP 01/11/24 1006 115/78     Systolic BP Percentile --      Diastolic BP Percentile --      Pulse Rate 01/11/24 1006 60     Resp 01/11/24 1006 18     Temp 01/11/24 1006 98.4 F (36.9 C)     Temp Source 01/11/24 1006 Oral     SpO2 01/11/24 1006 98 %     Weight 01/11/24 1007 175 lb (79.4 kg)     Height 01/11/24 1007 5\' 8"  (1.727 m)     Head Circumference --      Peak Flow --      Pain Score 01/11/24 1006 0     Pain Loc --      Pain Education --  Exclude from Growth Chart --    No data found.  Updated Vital Signs BP 115/78 (BP Location: Right Arm)   Pulse 60   Temp 98.4 F (36.9 C) (Oral)   Resp 18   Ht 5\' 8"  (1.727 m)   Wt 175 lb (79.4 kg)   LMP 01/09/2024 (Exact Date)   SpO2 98%   BMI 26.61 kg/m   Visual Acuity Right Eye Distance:   Left Eye Distance:   Bilateral Distance:    Right Eye Near:   Left Eye Near:    Bilateral Near:     Physical Exam Vitals reviewed.  Constitutional:      General: She is awake.     Appearance: Normal appearance. She is well-developed and well-groomed.  HENT:     Head: Normocephalic and atraumatic.  Eyes:     General: Lids are normal. Gaze aligned appropriately.     Extraocular Movements: Extraocular movements intact.     Conjunctiva/sclera: Conjunctivae normal.  Pulmonary:     Effort: Pulmonary effort is normal.  Neurological:     General: No focal deficit present.     Mental Status: She is alert and oriented to person, place, and time.     GCS: GCS eye subscore is 4. GCS verbal subscore is 5. GCS motor  subscore is 6.     Cranial Nerves: No cranial nerve deficit, dysarthria or facial asymmetry.  Psychiatric:        Attention and Perception: Attention and perception normal.        Mood and Affect: Mood and affect normal.        Speech: Speech normal.        Behavior: Behavior normal. Behavior is cooperative.      UC Treatments / Results  Labs (all labs ordered are listed, but only abnormal results are displayed) Labs Reviewed  RPR  HIV ANTIBODY (ROUTINE TESTING W REFLEX)  CERVICOVAGINAL ANCILLARY ONLY    EKG   Radiology No results found.  Procedures Procedures (including critical care time)  Medications Ordered in UC Medications - No data to display  Initial Impression / Assessment and Plan / UC Course  I have reviewed the triage vital signs and the nursing notes.  Pertinent labs & imaging results that were available during my care of the patient were reviewed by me and considered in my medical decision making (see chart for details).      Final Clinical Impressions(s) / UC Diagnoses   Final diagnoses:  Screening examination for STD (sexually transmitted disease)   Patient presents today expressing desire for STD screening following sexual encounter in which she did not use a condom or protection.  She denies current symptoms and states that her partner has not voiced any concerns for STD symptoms either.  Cervicovaginal swab collected to assess for BV, trichomoniasis, yeast, gonorrhea, chlamydia.  Blood work collected for HIV and syphilis testing.  Results to dictate further management.  Recommend refraining from sexual activity until test results are negative or she has completed an appropriate medication regimen.  Recommend using a barrier method to help prevent STD transmission in the future.  Follow-up as needed    Discharge Instructions      You were seen today for STD screening.  We collected a cervicovaginal swab that we will assess for gonorrhea,  chlamydia, trichomonas, bacterial vaginosis, yeast.  If collected blood work that we will assess for HIV and syphilis.  We will keep you updated with these results once they are  available.  If any medications are indicated by those test results we will call you and medications will either be sent to the pharmacy on file or you can return to the urgent care for an injection.  It is recommended that you refrain from sexual activity until your test results are negative or until you have completed an appropriate medication regimen as dictated by your test results.  Please use a condom or another barrier method to help prevent STD transmission.  Please make sure that you communicate with your partners regarding your test results should any positive results, about as they will also need to be tested and screened.    ED Prescriptions   None    PDMP not reviewed this encounter.   Makeshia Seat, Pearla Bottom, PA-C 01/11/24 1049

## 2024-01-12 ENCOUNTER — Ambulatory Visit (HOSPITAL_COMMUNITY): Payer: Self-pay

## 2024-01-12 LAB — CERVICOVAGINAL ANCILLARY ONLY
Bacterial Vaginitis (gardnerella): NEGATIVE
Candida Glabrata: NEGATIVE
Candida Vaginitis: POSITIVE — AB
Chlamydia: POSITIVE — AB
Comment: NEGATIVE
Comment: NEGATIVE
Comment: NEGATIVE
Comment: NEGATIVE
Comment: NEGATIVE
Comment: NORMAL
Neisseria Gonorrhea: NEGATIVE
Trichomonas: NEGATIVE

## 2024-01-12 MED ORDER — DOXYCYCLINE HYCLATE 100 MG PO TABS: 100.0000 mg | ORAL_TABLET | Freq: Two times a day (BID) | ORAL | 0 refills | Status: AC

## 2024-01-12 MED ORDER — FLUCONAZOLE 150 MG PO TABS
150.0000 mg | ORAL_TABLET | Freq: Once | ORAL | 0 refills | Status: AC
Start: 2024-01-12 — End: 2024-01-12

## 2024-01-13 LAB — RPR: RPR Ser Ql: NONREACTIVE

## 2024-01-13 LAB — HIV ANTIBODY (ROUTINE TESTING W REFLEX): HIV Screen 4th Generation wRfx: NONREACTIVE

## 2024-01-13 MED ORDER — ONDANSETRON 4 MG PO TBDP: 4.0000 mg | ORAL_TABLET | Freq: Two times a day (BID) | ORAL | 0 refills | Status: AC | PRN

## 2024-01-24 ENCOUNTER — Encounter: Payer: Self-pay | Admitting: Family

## 2024-01-24 ENCOUNTER — Other Ambulatory Visit: Payer: Self-pay

## 2024-01-24 ENCOUNTER — Ambulatory Visit
Admission: RE | Admit: 2024-01-24 | Discharge: 2024-01-24 | Disposition: A | Discharge status: Home / Self Care | Source: Ambulatory Visit | Attending: Physician Assistant | Admitting: Physician Assistant

## 2024-01-24 VITALS — BP 117/74 | HR 68 | Temp 98.2°F | Resp 18 | Ht 68.0 in | Wt 175.0 lb

## 2024-01-24 DIAGNOSIS — Z8619 Personal history of other infectious and parasitic diseases: Secondary | ICD-10-CM

## 2024-01-24 DIAGNOSIS — Z113 Encounter for screening for infections with a predominantly sexual mode of transmission: Secondary | ICD-10-CM

## 2024-01-24 DIAGNOSIS — A5602 Chlamydial vulvovaginitis: Secondary | ICD-10-CM | POA: Diagnosis not present

## 2024-01-24 NOTE — ED Triage Notes (Signed)
 Pt presents to urgent care for STD recheck. Tested positive for Chlamydia on 6/10 visit, completed antibiotic course two days ago. Pt currently denies symptoms however voices concerns of vomiting once after taking an antibiotic pill.

## 2024-01-24 NOTE — ED Provider Notes (Signed)
 [facility]    CSN: [id number] Arrival date & time: 01/24/24  0843      History   Chief Complaint Chief Complaint  Patient presents with   Follow-up    STD recheck completed antibiotics - Entered by patient    HPI Morgan Mcguire is a 18 y.o. female.   HPI  Pt is here for test of cure  She was seen for STD check on 6/10 and completed abx course 2 days ago  She denies symptoms at this time  She has not had intercourse since completing her abx regimen and is not concerned at this time for reinfection.   Past Medical History:  Diagnosis Date   Acute ear infection     Patient Active Problem List   Diagnosis Date Noted   Epigastric pain 06/03/2023    Past Surgical History:  Procedure Laterality Date   WISDOM TOOTH EXTRACTION      OB History   No obstetric history on file.      Home Medications    Prior to Admission medications   Medication Sig Start Date End Date Taking? Authorizing Provider  acetaminophen  (TYLENOL ) 500 MG tablet Take 500 mg by mouth every 6 (six) hours as needed.    [provider]  cetirizine  (ZYRTEC  ALLERGY ) 10 MG tablet Take 1 tablet (10 mg total) by mouth daily. 03/29/23   Christopher Savannah, PA-C  fluconazole  (DIFLUCAN ) 150 MG tablet Take 1 tablet (150 mg total) by mouth daily. May repeat in 3 days if needed. 08/03/23   Antonio Cyndee Jamee JONELLE, DO  meloxicam  (MOBIC ) 15 MG tablet Take 1 tablet (15 mg total) by mouth daily. 12/28/23   Jason Leita Repine, FNP  meloxicam  (MOBIC ) 15 MG tablet Take 1 tablet (15 mg total) by mouth daily. 01/11/24   Leonce Katz, DO  pantoprazole  (PROTONIX ) 20 MG tablet Take 1 tablet (20 mg total) by mouth daily. 11/11/23   Charlanne Groom, MD    Family History Family History  Problem Relation Age of Onset   Liver disease Neg Hx    Esophageal cancer Neg Hx    Colon cancer Neg Hx     Social History Social History   Tobacco Use   Smoking status: Never   Smokeless tobacco: Never  Vaping  Use   Vaping status: Never Used  Substance Use Topics   Alcohol use: Never   Drug use: Never     Allergies   Patient has no known allergies.   Review of Systems Review of Systems  Constitutional:  Negative for chills and fever.  Gastrointestinal:  Negative for abdominal pain.  Genitourinary:  Negative for dysuria, flank pain, pelvic pain, vaginal bleeding, vaginal discharge and vaginal pain.     Physical Exam Triage Vital Signs ED Triage Vitals  Encounter Vitals Group     BP 01/24/24 0853 117/74     Girls Systolic BP Percentile --      Girls Diastolic BP Percentile --      Boys Systolic BP Percentile --      Boys Diastolic BP Percentile --      Pulse Rate 01/24/24 0853 68     Resp 01/24/24 0853 18     Temp 01/24/24 0853 98.2 F (36.8 C)     Temp Source 01/24/24 0853 Oral     SpO2 01/24/24 0853 98 %     Weight 01/24/24 0854 175 lb 0.7 oz (79.4 kg)     Height 01/24/24 0854 5' 8 (1.727 m)  Head Circumference --      Peak Flow --      Pain Score 01/24/24 0854 0     Pain Loc --      Pain Education --      Exclude from Growth Chart --    No data found.  Updated Vital Signs BP 117/74 (BP Location: Right Arm)   Pulse 68   Temp 98.2 F (36.8 C) (Oral)   Resp 18   Ht 5' 8 (1.727 m)   Wt 175 lb 0.7 oz (79.4 kg)   LMP 01/09/2024 (Exact Date)   SpO2 98%   BMI 26.62 kg/m   Visual Acuity Right Eye Distance:   Left Eye Distance:   Bilateral Distance:    Right Eye Near:   Left Eye Near:    Bilateral Near:     Physical Exam Vitals reviewed.  Constitutional:      General: She is awake.     Appearance: Normal appearance. She is well-developed and well-groomed.  HENT:     Head: Normocephalic and atraumatic.   Eyes:     General: Lids are normal. Gaze aligned appropriately.     Extraocular Movements: Extraocular movements intact.     Conjunctiva/sclera: Conjunctivae normal.   Pulmonary:     Effort: Pulmonary effort is normal.   Neurological:      Mental Status: She is alert and oriented to person, place, and time. Mental status is at baseline.   Psychiatric:        Attention and Perception: Attention and perception normal.        Mood and Affect: Mood and affect normal.        Speech: Speech normal.        Behavior: Behavior normal. Behavior is cooperative.      UC Treatments / Results  Labs (all labs ordered are listed, but only abnormal results are displayed) Labs Reviewed  CERVICOVAGINAL ANCILLARY ONLY    EKG   Radiology No results found.  Procedures Procedures (including critical care time)  Medications Ordered in UC Medications - No data to display  Initial Impression / Assessment and Plan / UC Course  I have reviewed the triage vital signs and the nursing notes.  Pertinent labs & imaging results that were available during my care of the patient were reviewed by me and considered in my medical decision making (see chart for details).      Final Clinical Impressions(s) / UC Diagnoses   Final diagnoses:  Screen for STD (sexually transmitted disease)  History of chlamydia   Pt presents today with concern for retesting after treatment of chlamydia. She was diagnosed on 01/11/24 and completed recommended course of Doxycycline  2 days ago. She does not have concerns for reinfection at this time as she has refrained from intercourse. Reviewed with patient that more appropriate and accurate timeline for test of cure is 4 weeks after abx completion and there is possibility of false positive today. If testing is positive and pt remains asymptomatic, recommend monitoring vs retreatment and retesting in 2 weeks for more definitive test of cure. Pt is amenable to this plan. Cervicovaginal swab collected today for retesting. Results to guide further plan     Discharge Instructions      We will keep you updated on the results of your cervicovaginal swab once the results are available.  If medication or treatment is  indicated by those results that will be sent into the pharmacy that we have on file. Please make  sure that you are practicing safe sex and using barrier methods to prevent exposure. It is recommended to avoid intercourse until you have the results back from testing and have completed any treatments that are sent in for you.       ED Prescriptions   None    PDMP not reviewed this encounter.   Marylene Rocky FORBES DEVONNA 01/24/24 9060

## 2024-01-24 NOTE — Discharge Instructions (Addendum)
 We will keep you updated on the results of your cervicovaginal swab once the results are available.  If medication or treatment is indicated by those results that will be sent into the pharmacy that we have on file. Please make sure that you are practicing safe sex and using barrier methods to prevent exposure. It is recommended to avoid intercourse until you have the results back from testing and have completed any treatments that are sent in for you.

## 2024-01-25 ENCOUNTER — Ambulatory Visit (HOSPITAL_COMMUNITY): Payer: Self-pay

## 2024-01-25 LAB — CERVICOVAGINAL ANCILLARY ONLY
Bacterial Vaginitis (gardnerella): NEGATIVE
Candida Glabrata: NEGATIVE
Candida Vaginitis: POSITIVE — AB
Chlamydia: NEGATIVE
Comment: NEGATIVE
Comment: NEGATIVE
Comment: NEGATIVE
Comment: NEGATIVE
Comment: NEGATIVE
Comment: NORMAL
Neisseria Gonorrhea: NEGATIVE
Trichomonas: NEGATIVE

## 2024-01-25 MED ORDER — FLUCONAZOLE 150 MG PO TABS: 150.0000 mg | ORAL_TABLET | Freq: Once | ORAL | 0 refills | Status: AC

## 2024-01-28 ENCOUNTER — Ambulatory Visit: Admitting: Family Medicine

## 2024-01-28 ENCOUNTER — Ambulatory Visit (INDEPENDENT_AMBULATORY_CARE_PROVIDER_SITE_OTHER): Admitting: Family Medicine

## 2024-01-28 ENCOUNTER — Encounter: Payer: Self-pay | Admitting: Family Medicine

## 2024-01-28 VITALS — BP 102/80 | HR 83 | Temp 98.4°F | Resp 16 | Ht 68.0 in | Wt 177.0 lb

## 2024-01-28 DIAGNOSIS — M25561 Pain in right knee: Secondary | ICD-10-CM

## 2024-01-28 NOTE — Patient Instructions (Signed)
 Knee Exercises Ask your health care provider which exercises are safe for you. Do exercises exactly as told by your health care provider and adjust them as directed. It is normal to feel mild stretching, pulling, tightness, or discomfort as you do these exercises. Stop right away if you feel sudden pain or your pain gets worse. Do not begin these exercises until told by your health care provider. Stretching and range-of-motion exercises These exercises warm up your muscles and joints and improve the movement and flexibility of your knee. These exercises also help to relieve pain and swelling. Knee extension, prone  Lie on your abdomen (prone position) on a bed. Place your left / right knee just beyond the edge of the surface so your knee is not on the bed. You can put a towel under your left / right thigh just above your kneecap for comfort. Relax your leg muscles and allow gravity to straighten your knee (extension). You should feel a stretch behind your left / right knee. Hold this position for __________ seconds. Scoot up so your knee is supported between repetitions. Repeat __________ times. Complete this exercise __________ times a day. Knee flexion, active  Lie on your back with both legs straight. If this causes back discomfort, bend your left / right knee so your foot is flat on the floor. Slowly slide your left / right heel back toward your buttocks. Stop when you feel a gentle stretch in the front of your knee or thigh (flexion). Hold this position for __________ seconds. Slowly slide your left / right heel back to the starting position. Repeat __________ times. Complete this exercise __________ times a day. Quadriceps stretch, prone  Lie on your abdomen on a firm surface, such as a bed or padded floor. Bend your left / right knee and hold your ankle. If you cannot reach your ankle or pant leg, loop a belt around your foot and grab the belt instead. Gently pull your heel toward your  buttocks. Your knee should not slide out to the side. You should feel a stretch in the front of your thigh and knee (quadriceps). Hold this position for __________ seconds. Repeat __________ times. Complete this exercise __________ times a day. Hamstring, supine  Lie on your back (supine position). Loop a belt or towel over the ball of your left / right foot. The ball of your foot is on the walking surface, right under your toes. Straighten your left / right knee and slowly pull on the belt to raise your leg until you feel a gentle stretch behind your knee (hamstring). Do not let your knee bend while you do this. Keep your other leg flat on the floor. Hold this position for __________ seconds. Repeat __________ times. Complete this exercise __________ times a day. Strengthening exercises These exercises build strength and endurance in your knee. Endurance is the ability to use your muscles for a long time, even after they get tired. Quadriceps, isometric This exercise strengthens the muscles in front of your thigh (quadriceps) without moving your knee joint (isometric). Lie on your back with your left / right leg extended and your other knee bent. Put a rolled towel or small pillow under your knee if told by your health care provider. Slowly tense the muscles in the front of your left / right thigh. You should see your kneecap slide up toward your hip or see increased dimpling just above the knee. This motion will push the back of the knee toward the floor.  For __________ seconds, hold the muscle as tight as you can without increasing your pain. Relax the muscles slowly and completely. Repeat __________ times. Complete this exercise __________ times a day. Straight leg raises This exercise strengthens the muscles in front of your thigh (quadriceps) and the muscles that move your hips (hip flexors). Lie on your back with your left / right leg extended and your other knee bent. Tense the  muscles in the front of your left / right thigh. You should see your kneecap slide up or see increased dimpling just above the knee. Your thigh may even shake a bit. Keep these muscles tight as you raise your leg 4-6 inches (10-15 cm) off the floor. Do not let your knee bend. Hold this position for __________ seconds. Keep these muscles tense as you lower your leg. Relax your muscles slowly and completely after each repetition. Repeat __________ times. Complete this exercise __________ times a day. Hamstring, isometric  Lie on your back on a firm surface. Bend your left / right knee about __________ degrees. Dig your left / right heel into the surface as if you are trying to pull it toward your buttocks. Tighten the muscles in the back of your thighs (hamstring) to "dig" as hard as you can without increasing any pain. Hold this position for __________ seconds. Release the tension gradually and allow your muscles to relax completely for __________ seconds after each repetition. Repeat __________ times. Complete this exercise __________ times a day. Hamstring curls If told by your health care provider, do this exercise while wearing ankle weights. Begin with __________lb / kg weights. Then increase the weight by 1 lb (0.5 kg) increments. Do not wear ankle weights that are more than __________lb / kg. Lie on your abdomen with your legs straight. Bend your left / right knee as far as you can without feeling pain. Keep your hips flat against the floor. Hold this position for __________ seconds. Slowly lower your leg to the starting position. Repeat __________ times. Complete this exercise __________ times a day. Squats This exercise strengthens the muscles in front of your thigh and knee (quadriceps). Stand in front of a table, with your feet and knees pointing straight ahead. You may rest your hands on the table for balance but not for support. Slowly bend your knees and lower your hips like you  are going to sit in a chair. Keep your weight over your heels, not over your toes. Keep your lower legs upright so they are parallel with the table legs. Do not let your hips go lower than your knees. Do not bend lower than told by your health care provider. If your knee pain increases, do not bend as low. Hold the squat position for __________ seconds. Slowly push with your legs to return to standing. Do not use your hands to pull yourself to standing. Repeat __________ times. Complete this exercise __________ times a day. Wall slides This exercise strengthens the muscles in front of your thigh and knee (quadriceps). Lean your back against a smooth wall or door, and walk your feet out 18-24 inches (46-61 cm) from it. Place your feet hip-width apart. Slowly slide down the wall or door until your knees bend __________ degrees. Keep your knees over your heels, not over your toes. Keep your knees in line with your hips. Hold this position for __________ seconds. Repeat __________ times. Complete this exercise __________ times a day. Straight leg raises, side-lying This exercise strengthens the muscles that rotate  the leg at the hip and move it away from your body (hip abductors). Lie on your side with your left / right leg in the top position. Lie so your head, shoulder, knee, and hip line up. You may bend your bottom knee to help you keep your balance. Roll your hips slightly forward so your hips are stacked directly over each other and your left / right knee is facing forward. Leading with your heel, lift your top leg 4-6 inches (10-15 cm). You should feel the muscles in your outer hip lifting. Do not let your foot drift forward. Do not let your knee roll toward the ceiling. Hold this position for __________ seconds. Slowly return your leg to the starting position. Let your muscles relax completely after each repetition. Repeat __________ times. Complete this exercise __________ times a  day. Straight leg raises, prone This exercise stretches the muscles that move your hips away from the front of the pelvis (hip extensors). Lie on your abdomen on a firm surface. You can put a pillow under your hips if that is more comfortable. Tense the muscles in your buttocks and lift your left / right leg about 4-6 inches (10-15 cm). Keep your knee straight as you lift your leg. Hold this position for __________ seconds. Slowly lower your leg to the starting position. Let your leg relax completely after each repetition. Repeat __________ times. Complete this exercise __________ times a day. This information is not intended to replace advice given to you by your health care provider. Make sure you discuss any questions you have with your health care provider. Document Revised: 04/01/2021 Document Reviewed: 04/01/2021 Elsevier Patient Education  2024 [employer].
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
## 2024-02-08 ENCOUNTER — Encounter: Payer: Self-pay | Admitting: Family Medicine

## 2024-02-08 ENCOUNTER — Ambulatory Visit (INDEPENDENT_AMBULATORY_CARE_PROVIDER_SITE_OTHER): Admitting: Family Medicine

## 2024-02-08 ENCOUNTER — Other Ambulatory Visit: Payer: Self-pay | Admitting: Sports Medicine

## 2024-02-08 VITALS — BP 122/72 | HR 100 | Temp 98.0°F | Resp 16 | Ht 68.0 in | Wt 176.0 lb

## 2024-02-08 DIAGNOSIS — J029 Acute pharyngitis, unspecified: Secondary | ICD-10-CM

## 2024-02-08 LAB — POC COVID19 BINAXNOW: SARS Coronavirus 2 Ag: NEGATIVE

## 2024-02-08 LAB — POCT RAPID STREP A (OFFICE): Rapid Strep A Screen: NEGATIVE

## 2024-02-08 NOTE — Patient Instructions (Signed)
 Please contact the GI team at: [phone]  OK to take Tylenol  1000 mg (2 extra strength tabs) or 975 mg (3 regular strength tabs) every 6 hours as needed.  Ibuprofen  400-600 mg (2-3 over the counter strength tabs) every 6 hours as needed for pain.  Consider throat lozenges, salt water gargles and an air humidifier for symptomatic care.   Let us  know if you need anything.

## 2024-02-08 NOTE — Addendum Note (Signed)
 Addended by: Navaeh Kehres M on: 02/08/2024 03:31 PM   Modules accepted: Orders

## 2024-02-08 NOTE — Progress Notes (Signed)
 CC: ST  Morgan Mcguire here for URI complaints. Here w mom.   Duration: 2 days  Associated symptoms: sinus headache, sore throat, and fatigue Denies: sinus congestion, sinus pain, rhinorrhea, itchy watery eyes, ear pain, ear drainage, wheezing, shortness of breath, myalgia, and fevers, N/V/D, loss of taste/smell Treatment to date: ibuprofen  Sick contacts: No  Past Medical History:  Diagnosis Date   Acute ear infection     Objective BP 122/72 (BP Location: Left Arm, Patient Position: Sitting)   Pulse 100   Temp 98 F (36.7 C) (Oral)   Resp 16   Ht 5' 8 (1.727 m)   Wt 176 lb (79.8 kg)   LMP 01/09/2024 (Exact Date)   SpO2 98%   BMI 26.76 kg/m  General: Awake, alert, appears stated age HEENT: AT, [place], ears patent b/l and TM's neg, nares patent w/o discharge, pharynx pink and without exudates, MMM, no sinus ttp b/l Neck: No masses or asymmetry Heart: RRR Lungs: CTAB, no accessory muscle use Psych: Age appropriate judgment and insight, normal mood and affect  Sore throat  Continue to push fluids, practice good hand hygiene, cover mouth when coughing. Tylenol /ibuprofen . Consider throat lozenges, salt water gargles and an air humidifier for symptomatic care. Send message in 2-3 d if not improving.  F/u prn. If starting to experience fevers, shaking, or shortness of breath, seek immediate care. Pt and mom voiced understanding and agreement to the plan.  Mabel [place], DO 02/08/24 3:24 PM

## 2024-02-09 ENCOUNTER — Encounter: Payer: Self-pay | Admitting: Family Medicine

## 2024-02-10 ENCOUNTER — Emergency Department (HOSPITAL_BASED_OUTPATIENT_CLINIC_OR_DEPARTMENT_OTHER)

## 2024-02-10 ENCOUNTER — Encounter (HOSPITAL_BASED_OUTPATIENT_CLINIC_OR_DEPARTMENT_OTHER): Payer: Self-pay

## 2024-02-10 ENCOUNTER — Ambulatory Visit: Admitting: Family Medicine

## 2024-02-10 ENCOUNTER — Emergency Department (HOSPITAL_BASED_OUTPATIENT_CLINIC_OR_DEPARTMENT_OTHER)
Admission: EM | Admit: 2024-02-10 | Discharge: 2024-02-10 | Disposition: A | Discharge status: Home / Self Care | Attending: Emergency Medicine | Admitting: Emergency Medicine

## 2024-02-10 ENCOUNTER — Other Ambulatory Visit: Payer: Self-pay

## 2024-02-10 DIAGNOSIS — R051 Acute cough: Secondary | ICD-10-CM | POA: Insufficient documentation

## 2024-02-10 DIAGNOSIS — G8929 Other chronic pain: Secondary | ICD-10-CM | POA: Diagnosis not present

## 2024-02-10 DIAGNOSIS — R5381 Other malaise: Secondary | ICD-10-CM | POA: Insufficient documentation

## 2024-02-10 DIAGNOSIS — R5383 Other fatigue: Secondary | ICD-10-CM | POA: Insufficient documentation

## 2024-02-10 DIAGNOSIS — R1013 Epigastric pain: Secondary | ICD-10-CM | POA: Diagnosis not present

## 2024-02-10 DIAGNOSIS — R059 Cough, unspecified: Secondary | ICD-10-CM | POA: Diagnosis not present

## 2024-02-10 DIAGNOSIS — R042 Hemoptysis: Secondary | ICD-10-CM | POA: Insufficient documentation

## 2024-02-10 LAB — CBC WITH DIFFERENTIAL/PLATELET
Abs Immature Granulocytes: 0.02 K/uL (ref 0.00–0.07)
Basophils Absolute: 0 K/uL (ref 0.0–0.1)
Basophils Relative: 1 %
Eosinophils Absolute: 0.1 K/uL (ref 0.0–0.5)
Eosinophils Relative: 2 %
HCT: 39.1 % (ref 36.0–46.0)
Hemoglobin: 12.7 g/dL (ref 12.0–15.0)
Immature Granulocytes: 0 %
Lymphocytes Relative: 26 %
Lymphs Abs: 1.9 K/uL (ref 0.7–4.0)
MCH: 27.2 pg (ref 26.0–34.0)
MCHC: 32.5 g/dL (ref 30.0–36.0)
MCV: 83.7 fL (ref 80.0–100.0)
Monocytes Absolute: 0.4 K/uL (ref 0.1–1.0)
Monocytes Relative: 6 %
Neutro Abs: 4.6 K/uL (ref 1.7–7.7)
Neutrophils Relative %: 65 %
Platelets: 183 K/uL (ref 150–400)
RBC: 4.67 MIL/uL (ref 3.87–5.11)
RDW: 13.7 % (ref 11.5–15.5)
WBC: 7 K/uL (ref 4.0–10.5)
nRBC: 0 % (ref 0.0–0.2)

## 2024-02-10 LAB — BASIC METABOLIC PANEL WITH GFR
Anion gap: 12 (ref 5–15)
BUN: 8 mg/dL (ref 6–20)
CO2: 23 mmol/L (ref 22–32)
Calcium: 9.2 mg/dL (ref 8.9–10.3)
Chloride: 106 mmol/L (ref 98–111)
Creatinine, Ser: 0.8 mg/dL (ref 0.44–1.00)
GFR, Estimated: >60 mL/min (ref >60)
Glucose, Bld: 81 mg/dL (ref 70–99)
Potassium: 4.3 mmol/L (ref 3.5–5.1)
Sodium: 140 mmol/L (ref 135–145)

## 2024-02-10 MED ORDER — BENZONATATE 100 MG PO CAPS: 100.0000 mg | ORAL_CAPSULE | Freq: Three times a day (TID) | ORAL | 0 refills | Status: DC
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
## 2024-02-10 NOTE — ED Notes (Signed)
 Pt returned from X Ray.

## 2024-02-10 NOTE — Telephone Encounter (Signed)
 Appt today w/ Dr. Etter Sjogren

## 2024-02-10 NOTE — Discharge Instructions (Addendum)
 Follow-up with your primary care doctor as needed.  Take Tessalon  Perles as prescribed.  Salt water gargles for sore throat.  This should resolve with time.  You can return to the emergency department for any worsening symptoms such as worsening shortness of breath, worsening cough, fever, or any other concerns.

## 2024-02-10 NOTE — ED Provider Notes (Signed)
  EMERGENCY DEPARTMENT AT [facility] Provider Note   CSN: [id number] Arrival date & time: 02/10/24  1109     Patient presents with: Cough   Morgan Mcguire is a 18 y.o. female patient who presents to the emergency department today for further evaluation of dry cough, 1 episode of hemoptysis, general malaise, and blood-tinged stool that started yesterday.  Patient went to the pool on Sunday with some friends and shortly after that started having some fatigue and malaise.  She was complaining of sore throat initially.  She was seen evaluated by her PCP and had a negative strep and COVID test.  Sore throat still persistent.  Hurts primarily when she swallows.  Is also having a dry cough she had an isolated episode of blood-tinged sputum couple of days ago.  Patient also had 2 episodes of diarrhea yesterday with some bloody tinged stool.  Patient has an appointment next week with gastroenterology for some chronic epigastric pain after eating.  Mother requests blood work.  No fever or chills.    Cough      Prior to Admission medications   Medication Sig Start Date End Date Taking? Authorizing Provider  benzonatate  (TESSALON ) 100 MG capsule Take 1 capsule (100 mg total) by mouth every 8 (eight) hours. 02/10/24  Yes Miasia Crabtree M, PA-C  acetaminophen  (TYLENOL ) 500 MG tablet Take 500 mg by mouth every 6 (six) hours as needed.    [provider]  meloxicam  (MOBIC ) 15 MG tablet Take 1 tablet (15 mg total) by mouth daily. 01/11/24   Leonce Katz, DO  pantoprazole  (PROTONIX ) 20 MG tablet Take 1 tablet (20 mg total) by mouth daily. 11/11/23   Charlanne Groom, MD    Allergies: Patient has no known allergies.    Review of Systems  Respiratory:  Positive for cough.   All other systems reviewed and are negative.   Updated Vital Signs BP 129/83 (BP Location: Left Arm)   Pulse 80   Temp 98.2 F (36.8 C) (Oral)   Resp 17   Ht 5' 8 (1.727 m)   Wt 79.8 kg   LMP  01/09/2024 (Exact Date)   SpO2 99%   BMI 26.76 kg/m   Physical Exam Vitals and nursing note reviewed.  Constitutional:      General: She is not in acute distress.    Appearance: Normal appearance.  HENT:     Head: Normocephalic and atraumatic.     Right Ear: Tympanic membrane, ear canal and external ear normal.     Left Ear: Tympanic membrane, ear canal and external ear normal.     Mouth/Throat:     Comments: Posterior pharynx is normal.  Slightly erythematous.  No tonsillar hypertrophy or exudate.  Uvula is midline.  Tongue is normal. Eyes:     General:        Right eye: No discharge.        Left eye: No discharge.  Cardiovascular:     Comments: Regular rate and rhythm.  S1/S2 are distinct without any evidence of murmur, rubs, or gallops.  Radial pulses are 2+ bilaterally.  Dorsalis pedis pulses are 2+ bilaterally.  No evidence of pedal edema. Pulmonary:     Comments: Clear to auscultation bilaterally.  Normal effort.  No respiratory distress.  No evidence of wheezes, rales, or rhonchi heard throughout. Abdominal:     General: Abdomen is flat. Bowel sounds are normal. There is no distension.     Tenderness: There is no abdominal tenderness.  There is no guarding or rebound.  Musculoskeletal:        General: Normal range of motion.     Cervical back: Neck supple.  Skin:    General: Skin is warm and dry.     Findings: No rash.  Neurological:     General: No focal deficit present.     Mental Status: She is alert.  Psychiatric:        Mood and Affect: Mood normal.        Behavior: Behavior normal.     (all labs ordered are listed, but only abnormal results are displayed) Labs Reviewed  CBC WITH DIFFERENTIAL/PLATELET  BASIC METABOLIC PANEL WITH GFR    EKG: None  Radiology: DG Chest 2 View Result Date: 02/10/2024 CLINICAL DATA:  Cough. EXAM: CHEST - 2 VIEW COMPARISON:  July 12, 2023. FINDINGS: The heart size and mediastinal contours are within normal limits. Both  lungs are clear. The visualized skeletal structures are unremarkable. IMPRESSION: No active cardiopulmonary disease. Electronically Signed   By: Lynwood Landy Raddle M.D.   On: 02/10/2024 13:17     Procedures   Medications Ordered in the ED - No data to display  Clinical Course as of 02/10/24 1332  Thu Feb 10, 2024  1303 CBC with Differential Negative.  [CF]  1320 Basic metabolic panel Negative. [CF]  1320 DG Chest 2 View Negative.  I do agree with the radiologist interpretation. [CF]    Clinical Course User Index [CF] Theotis Cameron HERO, PA-C    Medical Decision Making Morgan Mcguire is a 18 y.o. female patient who presents to the emergency department today for further evaluation of upper respiratory type symptoms.  I believe this is likely viral in nature.  Patient is afebrile with normal vital signs here.  She has not had any further episodes of Hematochezia.  No melena.  Will get some basic labs to look at her hemoglobin status and electrolytes and a chest x-ray.  Low suspicion for otitis media or externa.  Work appears negative.  Vital signs continue to remain normal.  She is afebrile.  No white count.  No findings of pneumonia on chest x-ray.  Will give her Tessalon  Perles for cough.  We also talked about over-the-counter remedies such as warm salt water gargles for her sore throat, honey for cough.  She will follow-up with her GI doctor next week.  She was encouraged to return to her primary care doctor for further evaluation.  Strict turn precautions were discussed.  She is safe for discharge at this time.  Amount and/or Complexity of Data Reviewed Labs: ordered. Decision-making details documented in ED Course. Radiology: ordered. Decision-making details documented in ED Course.     Final diagnoses:  Acute cough    ED Discharge Orders          Ordered    benzonatate  (TESSALON ) 100 MG capsule  Every 8 hours        02/10/24 [address] [place],  [place] 02/10/24 1332    Doretha Folks, MD 02/12/24 [phone]

## 2024-02-10 NOTE — ED Triage Notes (Signed)
 Pt arrives with mother who reports patient started feeling fatigued, with sore throat starting on Monday was tested for covid flu and strep all were negative.

## 2024-02-10 NOTE — ED Notes (Signed)
 Pt alert and oriented X 4 at the time of discharge. RR even and unlabored. No acute distress noted. Pt verbalized understanding of discharge instructions as discussed. Pt ambulatory to lobby at time of discharge.

## 2024-02-11 ENCOUNTER — Ambulatory Visit (INDEPENDENT_AMBULATORY_CARE_PROVIDER_SITE_OTHER): Admitting: Sports Medicine

## 2024-02-11 VITALS — HR 62 | Ht 68.0 in | Wt 176.0 lb

## 2024-02-11 DIAGNOSIS — G8929 Other chronic pain: Secondary | ICD-10-CM

## 2024-02-11 DIAGNOSIS — M25561 Pain in right knee: Secondary | ICD-10-CM | POA: Diagnosis not present

## 2024-02-11 NOTE — Patient Instructions (Addendum)
 Recommend call and schedule MRI  Tylenol  [phone] mg 2-3 times a day for pain relief  - Use meloxicam  15 mg daily as needed for pain.  Recommend limiting chronic NSAIDs to 1-2 doses per week to prevent long-term side effects. PT referral  Follow up 1 week after MRI to discuss results

## 2024-02-16 ENCOUNTER — Encounter: Payer: Self-pay | Admitting: Gastroenterology

## 2024-02-16 ENCOUNTER — Ambulatory Visit (INDEPENDENT_AMBULATORY_CARE_PROVIDER_SITE_OTHER): Admitting: Gastroenterology

## 2024-02-16 VITALS — BP 112/74 | HR 74 | Ht 68.0 in | Wt 173.1 lb

## 2024-02-16 DIAGNOSIS — R1013 Epigastric pain: Secondary | ICD-10-CM | POA: Diagnosis not present

## 2024-02-16 DIAGNOSIS — K625 Hemorrhage of anus and rectum: Secondary | ICD-10-CM

## 2024-02-16 MED ORDER — PANTOPRAZOLE SODIUM 20 MG PO TBEC: 20.0000 mg | DELAYED_RELEASE_TABLET | Freq: Every day | ORAL | 6 refills | Status: AC

## 2024-02-16 NOTE — Patient Instructions (Addendum)
 _______________________________________________________  If your blood pressure at your visit was 140/90 or greater, please contact your primary care physician to follow up on this.  _______________________________________________________  If you are age 20 or older, your body mass index should be between 23-30. Your Body mass index is 26.32 kg/m. If this is out of the aforementioned range listed, please consider follow up with your Primary Care Provider.  If you are age 60 or younger, your body mass index should be between 19-25. Your Body mass index is 26.32 kg/m. If this is out of the aformentioned range listed, please consider follow up with your Primary Care Provider.   ________________________________________________________  The [facility] providers would like to encourage you to use MYCHART to communicate with providers for non-urgent requests or questions.  Due to long hold times on the telephone, sending your provider a message by [facility] may be a faster and more efficient way to get a response.  Please allow 48 business hours for a response.  Please remember that this is for non-urgent requests.  _______________________________________________________  We have sent the following medications to your pharmacy for you to pick up at your convenience: Protonix   You have been scheduled for an endoscopy and colonoscopy. Please follow the written instructions given to you at your visit today.  If you use inhalers (even only as needed), please bring them with you on the day of your procedure.  DO NOT TAKE 7 DAYS PRIOR TO TEST- Trulicity (dulaglutide) Ozempic, Wegovy (semaglutide) Mounjaro (tirzepatide) Bydureon Bcise (exanatide extended release)  DO NOT TAKE 1 DAY PRIOR TO YOUR TEST Rybelsus (semaglutide) Adlyxin (lixisenatide) Victoza (liraglutide) Byetta (exanatide) ___________________________________________________________________________  Thank you,  Dr. Lynnie Bring

## 2024-02-16 NOTE — Progress Notes (Signed)
 Chief Complaint: FU  Referring Provider:  Antonio Meth, Jamee SAUNDERS, *      ASSESSMENT AND PLAN;   #1. Epi pain. Neg US  06/2023 #2. Rectal bleeding. Nl CBC   Plan: -protonix   20mg  po every day #30, 4RF -EGD/colon with miralax (pref before schoolstarts Aug 11) -If still with problems and above WU is neg, would proceed with CT scan abdo/pelvis.    I discussed EGD/Colonoscopy- the indications, risks, alternatives and potential complications including, but not limited to bleeding, infection, reaction to meds, damage to internal organs, cardiac and/or pulmonary problems, and perforation requiring surgery. The possibility that significant findings could be missed was explained. All ? were answered. Pt consents to proceed. HPI:    Morgan Mcguire is a 18 y.o. female  Accompanied by her mother. 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  Brings in pictures of bright red blood along with stool.  Not completely mixed  She experienced blood in her stool on two occasions. The first occurrence was on July 6, following a day at the pool, where she also developed symptoms resembling an upper respiratory infection, including coughing and a sore throat. She noted a loose stool with light-colored blood, but no blood was present when wiping. The second occurrence was on the morning of the visit, with another loose stool containing blood, for which pictures were taken to show the provider.  She has been taking ibuprofen , 600 mg, approximately three times over the past week for a sore throat but has not taken any recently. No abdominal pain except when eating. She has not undergone an endoscopy yet due to scheduling conflicts with school exams.  Her bowel movements occur every other day, sometimes presenting as small balls or diarrhea. She experiences occasional loud noises and spasms in her abdomen. She is currently taking Protonix , though not consistently.  She also had epigastric discomfort  previously and was scheduled to have EGD which she canceled due to conflict with school exams.  No ibuprofen    History of Present Illness     Past Medical History:  Diagnosis Date   Acute ear infection     Past Surgical History:  Procedure Laterality Date   WISDOM TOOTH EXTRACTION      Family History  Problem Relation Age of Onset   Liver disease Neg Hx    Esophageal cancer Neg Hx    Colon cancer Neg Hx     Social History   Tobacco Use   Smoking status: Never   Smokeless tobacco: Never  Vaping Use   Vaping status: Never Used  Substance Use Topics   Alcohol use: Never   Drug use: Never    Current Outpatient Medications  Medication Sig Dispense Refill   acetaminophen  (TYLENOL ) 500 MG tablet Take 500 mg by mouth every 6 (six) hours as needed.     benzonatate  (TESSALON ) 100 MG capsule Take 1 capsule (100 mg total) by mouth every 8 (eight) hours. 21 capsule 0   meloxicam  (MOBIC ) 15 MG tablet Take 1 tablet (15 mg total) by mouth daily. 30 tablet 0   pantoprazole  (PROTONIX ) 20 MG tablet Take 1 tablet (20 mg total) by mouth daily. 90 tablet 3   tretinoin (RETIN-A) 0.025 % cream Apply 1 Application topically at bedtime.     No current facility-administered medications for this visit.    No Known Allergies  Review of Systems:  Constitutional: Denies fever, chills, diaphoresis, appetite change and fatigue.  HEENT: Denies photophobia, eye  pain, redness, hearing loss, ear pain, congestion, sore throat, rhinorrhea, sneezing, mouth sores, neck pain, neck stiffness and tinnitus.   Respiratory: Denies SOB, DOE, cough, chest tightness,  and wheezing.   Cardiovascular: Denies chest pain, palpitations and leg swelling.  Genitourinary: Denies dysuria, urgency, frequency, hematuria, flank pain and difficulty urinating.  Musculoskeletal: Denies myalgias, back pain, joint swelling, arthralgias and gait problem.  Skin: No rash.  Neurological: Denies dizziness, seizures, syncope,  weakness, light-headedness, numbness and headaches.  Hematological: Denies adenopathy. Easy bruising, personal or family bleeding history  Psychiatric/Behavioral: No anxiety or depression     Physical Exam:    BP 112/74   Pulse 74   Ht 5' 8 (1.727 m)   Wt 173 lb 2 oz (78.5 kg)   SpO2 95%   BMI 26.32 kg/m  Wt Readings from Last 3 Encounters:  02/16/24 173 lb 2 oz (78.5 kg) (93%, Z= 1.45)*  02/11/24 176 lb (79.8 kg) (93%, Z= 1.51)*  02/10/24 176 lb (79.8 kg) (93%, Z= 1.51)*   * Growth percentiles are based on CDC (Girls, 2-20 Years) data.   Constitutional:  Well-developed, in no acute distress. Psychiatric: Normal mood and affect. Behavior is normal. HEENT: Pupils normal.  Conjunctivae are normal. No scleral icterus. Cardiovascular: Normal rate, regular rhythm. No edema Pulmonary/chest: Effort normal and breath sounds normal. No wheezing, rales or rhonchi. Abdominal: Soft, nondistended. Nontender. Bowel sounds active throughout. There are no masses palpable. No hepatomegaly. Rectal: Deferred.  To be performed at the time of colonoscopy Neurological: Alert and oriented to person place and time. Skin: Skin is warm and dry. No rashes noted.  Data Reviewed: I have personally reviewed following labs and imaging studies  CBC:    Latest Ref Rng & Units 02/10/2024   12:03 PM 11/11/2023   11:22 AM 07/12/2023   10:59 PM  CBC  WBC 4.0 - 10.5 K/uL 7.0  5.5  5.7   Hemoglobin 12.0 - 15.0 g/dL 87.2  87.0  87.2   Hematocrit 36.0 - 46.0 % 39.1  40.2  39.3   Platelets 150 - 400 K/uL 183  203.0  119     CMP:    Latest Ref Rng & Units 02/10/2024   12:03 PM 11/11/2023   11:22 AM 07/12/2023   10:59 PM  CMP  Glucose 70 - 99 mg/dL 81  96  93   BUN 6 - 20 mg/dL 8  13  11    Creatinine 0.44 - 1.00 mg/dL 9.19  9.07  8.99   Sodium 135 - 145 mmol/L 140  137  136   Potassium 3.5 - 5.1 mmol/L 4.3  4.2  3.4   Chloride 98 - 111 mmol/L 106  104  105   CO2 22 - 32 mmol/L 23  25  24    Calcium 8.9 -  10.3 mg/dL 9.2  9.4  9.3   Total Protein 6.0 - 8.3 g/dL  7.6    Total Bilirubin 0.2 - 1.2 mg/dL  0.3    Alkaline Phos 47 - 119 U/L  48    AST 0 - 37 U/L  31    ALT 0 - 35 U/L  23        Anselm Bring, MD 02/16/2024, 11:05 AM  Cc: Antonio Meth, Jamee SAUNDERS, *

## 2024-02-25 ENCOUNTER — Ambulatory Visit

## 2024-02-27 ENCOUNTER — Other Ambulatory Visit

## 2024-03-03 ENCOUNTER — Encounter: Payer: Self-pay | Admitting: Gastroenterology

## 2024-03-03 ENCOUNTER — Ambulatory Visit: Admitting: Gastroenterology

## 2024-03-03 VITALS — BP 122/84 | HR 49 | Temp 97.9°F | Resp 17 | Ht 68.0 in | Wt 173.0 lb

## 2024-03-03 DIAGNOSIS — K635 Polyp of colon: Secondary | ICD-10-CM | POA: Diagnosis not present

## 2024-03-03 DIAGNOSIS — K625 Hemorrhage of anus and rectum: Secondary | ICD-10-CM

## 2024-03-03 DIAGNOSIS — D125 Benign neoplasm of sigmoid colon: Secondary | ICD-10-CM

## 2024-03-03 DIAGNOSIS — R1013 Epigastric pain: Secondary | ICD-10-CM | POA: Diagnosis not present

## 2024-03-03 MED ORDER — FLEET ENEMA RE ENEM
1.0000 | ENEMA | Freq: Once | RECTAL | Status: AC
  Administered 2024-03-03: 1 via RECTAL

## 2024-03-03 MED ORDER — SODIUM CHLORIDE 0.9 % IV SOLN: 500.0000 mL | Freq: Once | INTRAVENOUS | Status: DC

## 2024-03-03 NOTE — Progress Notes (Signed)
 Called to room to assist during endoscopic procedure.  Patient ID and intended procedure confirmed with present staff. Received instructions for my participation in the procedure from the performing physician.

## 2024-03-03 NOTE — Progress Notes (Signed)
0929 Robinul 0.1 mg IV given due large amount of secretions upon assessment.  MD made aware, vss

## 2024-03-03 NOTE — Patient Instructions (Addendum)
 Resume previous diet  Continue present medications including Protonix  20mg  daily X 2 weeks, then take as needed  Take Miralax 1 capful (17 grams) in 8 ounces of water daily for 12 weeks, then take as needed Preparation H ointment twice daily as needed. Apply externally as necessary Await pathology results See handout for polyps  YOU HAD AN ENDOSCOPIC PROCEDURE TODAY AT THE [facility]:   Refer to the procedure report that was given to you for any specific questions about what was found during the examination.  If the procedure report does not answer your questions, please call your gastroenterologist to clarify.  If you requested that your care partner not be given the details of your procedure findings, then the procedure report has been included in a sealed envelope for you to review at your convenience later.  YOU SHOULD EXPECT: Some feelings of bloating in the abdomen. Passage of more gas than usual.  Walking can help get rid of the air that was put into your GI tract during the procedure and reduce the bloating. If you had a lower endoscopy (such as a colonoscopy or flexible sigmoidoscopy) you may notice spotting of blood in your stool or on the toilet paper. If you underwent a bowel prep for your procedure, you may not have a normal bowel movement for a few days.  Please Note:  You might notice some irritation and congestion in your nose or some drainage.  This is from the oxygen used during your procedure.  There is no need for concern and it should clear up in a day or so.  SYMPTOMS TO REPORT IMMEDIATELY: Following lower endoscopy (colonoscopy or flexible sigmoidoscopy):  Excessive amounts of blood in the stool  Significant tenderness or worsening of abdominal pains  Swelling of the abdomen that is new, acute  Fever of 100F or higher  Following upper endoscopy (EGD)  Vomiting of blood or coffee ground material  New chest pain or pain under the shoulder blades  Painful or  persistently difficult swallowing  New shortness of breath  Black, tarry-looking stools  For urgent or emergent issues, a gastroenterologist can be reached at any hour by calling [phone]. Do not use MyChart messaging for urgent concerns.   DIET:  We do recommend a small meal at first, but then you may proceed to your regular diet.  Drink plenty of fluids but you should avoid alcoholic beverages for 24 hours.  ACTIVITY:  You should plan to take it easy for the rest of today and you should NOT DRIVE or use heavy machinery until tomorrow (because of the sedation medicines used during the test).    FOLLOW UP: Our staff will call the number listed on your records the next business day following your procedure.  We will call around 7:15- 8:00 am to check on you and address any questions or concerns that you may have regarding the information given to you following your procedure. If we do not reach you, we will leave a message.     If any biopsies were taken you will be contacted by phone or by letter within the next 1-3 weeks.  Please call us  at [phone] if you have not heard about the biopsies in 3 weeks.   SIGNATURES/CONFIDENTIALITY: You and/or your care partner have signed paperwork which will be entered into your electronic medical record.  These signatures attest to the fact that that the information above on your After Visit Summary has been reviewed and is  understood.  Full responsibility of the confidentiality of this discharge information lies with you and/or your care-partner.

## 2024-03-03 NOTE — Progress Notes (Signed)
 Chief Complaint: FU  Referring Provider:  Antonio Meth, Jamee SAUNDERS, *      ASSESSMENT AND PLAN;   #1. Epi pain. Neg US  06/2023 #2. Rectal bleeding. Nl CBC   Plan: -protonix   20mg  po every day #30, 4RF -EGD/colon with miralax (pref before schoolstarts Aug 11) -If still with problems and above WU is neg, would proceed with CT scan abdo/pelvis.    I discussed EGD/Colonoscopy- the indications, risks, alternatives and potential complications including, but not limited to bleeding, infection, reaction to meds, damage to internal organs, cardiac and/or pulmonary problems, and perforation requiring surgery. The possibility that significant findings could be missed was explained. All ? were answered. Pt consents to proceed. HPI:    Morgan Mcguire is a 18 y.o. female  Accompanied by her mother. 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  Brings in pictures of bright red blood along with stool.  Not completely mixed  She experienced blood in her stool on two occasions. The first occurrence was on July 6, following a day at the pool, where she also developed symptoms resembling an upper respiratory infection, including coughing and a sore throat. She noted a loose stool with light-colored blood, but no blood was present when wiping. The second occurrence was on the morning of the visit, with another loose stool containing blood, for which pictures were taken to show the provider.  She has been taking ibuprofen , 600 mg, approximately three times over the past week for a sore throat but has not taken any recently. No abdominal pain except when eating. She has not undergone an endoscopy yet due to scheduling conflicts with school exams.  Her bowel movements occur every other day, sometimes presenting as small balls or diarrhea. She experiences occasional loud noises and spasms in her abdomen. She is currently taking Protonix , though not consistently.  She also had epigastric discomfort  previously and was scheduled to have EGD which she canceled due to conflict with school exams.  No ibuprofen    History of Present Illness     Past Medical History:  Diagnosis Date   Acute ear infection     Past Surgical History:  Procedure Laterality Date   WISDOM TOOTH EXTRACTION      Family History  Problem Relation Age of Onset   Liver disease Neg Hx    Esophageal cancer Neg Hx    Colon cancer Neg Hx    Rectal cancer Neg Hx    Stomach cancer Neg Hx     Social History   Tobacco Use   Smoking status: Never   Smokeless tobacco: Never  Vaping Use   Vaping status: Never Used  Substance Use Topics   Alcohol use: Never   Drug use: Never    Current Outpatient Medications  Medication Sig Dispense Refill   tretinoin (RETIN-A) 0.025 % cream Apply 1 Application topically at bedtime.     acetaminophen  (TYLENOL ) 500 MG tablet Take 500 mg by mouth every 6 (six) hours as needed.     meloxicam  (MOBIC ) 15 MG tablet Take 1 tablet (15 mg total) by mouth daily. 30 tablet 0   pantoprazole  (PROTONIX ) 20 MG tablet Take 1 tablet (20 mg total) by mouth daily. 30 tablet 6   Current Facility-Administered Medications  Medication Dose Route Frequency Provider Last Rate Last Admin   0.9 %  sodium chloride infusion  500 mL Intravenous Once Charlanne Groom, MD        No Known Allergies  Review of Systems:  Constitutional: Denies fever, chills, diaphoresis, appetite change and fatigue.  HEENT: Denies photophobia, eye pain, redness, hearing loss, ear pain, congestion, sore throat, rhinorrhea, sneezing, mouth sores, neck pain, neck stiffness and tinnitus.   Respiratory: Denies SOB, DOE, cough, chest tightness,  and wheezing.   Cardiovascular: Denies chest pain, palpitations and leg swelling.  Genitourinary: Denies dysuria, urgency, frequency, hematuria, flank pain and difficulty urinating.  Musculoskeletal: Denies myalgias, back pain, joint swelling, arthralgias and gait problem.  Skin: No  rash.  Neurological: Denies dizziness, seizures, syncope, weakness, light-headedness, numbness and headaches.  Hematological: Denies adenopathy. Easy bruising, personal or family bleeding history  Psychiatric/Behavioral: No anxiety or depression     Physical Exam:    BP (!) 140/82   Pulse 66   Temp 97.9 F (36.6 C) (Temporal)   Ht 5' 8 (1.727 m)   Wt 173 lb (78.5 kg)   LMP 02/28/2024 (Exact Date)   SpO2 100%   BMI 26.30 kg/m  Wt Readings from Last 3 Encounters:  03/03/24 173 lb (78.5 kg) (93%, Z= 1.44)*  02/16/24 173 lb 2 oz (78.5 kg) (93%, Z= 1.45)*  02/11/24 176 lb (79.8 kg) (93%, Z= 1.51)*   * Growth percentiles are based on CDC (Girls, 2-20 Years) data.   Constitutional:  Well-developed, in no acute distress. Psychiatric: Normal mood and affect. Behavior is normal. HEENT: Pupils normal.  Conjunctivae are normal. No scleral icterus. Cardiovascular: Normal rate, regular rhythm. No edema Pulmonary/chest: Effort normal and breath sounds normal. No wheezing, rales or rhonchi. Abdominal: Soft, nondistended. Nontender. Bowel sounds active throughout. There are no masses palpable. No hepatomegaly. Rectal: Deferred.  To be performed at the time of colonoscopy Neurological: Alert and oriented to person place and time. Skin: Skin is warm and dry. No rashes noted.  Data Reviewed: I have personally reviewed following labs and imaging studies  CBC:    Latest Ref Rng & Units 02/10/2024   12:03 PM 11/11/2023   11:22 AM 07/12/2023   10:59 PM  CBC  WBC 4.0 - 10.5 K/uL 7.0  5.5  5.7   Hemoglobin 12.0 - 15.0 g/dL 87.2  87.0  87.2   Hematocrit 36.0 - 46.0 % 39.1  40.2  39.3   Platelets 150 - 400 K/uL 183  203.0  119     CMP:    Latest Ref Rng & Units 02/10/2024   12:03 PM 11/11/2023   11:22 AM 07/12/2023   10:59 PM  CMP  Glucose 70 - 99 mg/dL 81  96  93   BUN 6 - 20 mg/dL 8  13  11    Creatinine 0.44 - 1.00 mg/dL 9.19  9.07  8.99   Sodium 135 - 145 mmol/L 140  137  136    Potassium 3.5 - 5.1 mmol/L 4.3  4.2  3.4   Chloride 98 - 111 mmol/L 106  104  105   CO2 22 - 32 mmol/L 23  25  24    Calcium 8.9 - 10.3 mg/dL 9.2  9.4  9.3   Total Protein 6.0 - 8.3 g/dL  7.6    Total Bilirubin 0.2 - 1.2 mg/dL  0.3    Alkaline Phos 47 - 119 U/L  48    AST 0 - 37 U/L  31    ALT 0 - 35 U/L  23        Anselm Bring, MD 03/03/2024, 9:16 AM  Cc: Antonio Meth, Jamee SAUNDERS, *

## 2024-03-03 NOTE — Progress Notes (Signed)
 Updated medical record  900-pt reports only cloudy brown liquid coming out the whold time of the prep, reprot to Dr Charlanne, fleet enema ordered
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
## 2024-03-03 NOTE — Progress Notes (Signed)
 Report given to PACU, vss

## 2024-03-03 NOTE — Op Note (Signed)
 [facility] Patient Name: Morgan Mcguire Procedure Date: 03/03/2024 9:25 AM MRN: [id number] Endoscopist: Lynnie Bring , MD, [id number] Age: 18 Referring MD:  Date of Birth: April 10, 2006 Gender: Female Account #: [id number] Procedure:                Colonoscopy Indications:              Rectal bleeding with normal CBC (resolved) Medicines:                Monitored Anesthesia Care Procedure:                Pre-Anesthesia Assessment:                           - Prior to the procedure, a History and Physical                            was performed, and patient medications and                            allergies were reviewed. The patient's tolerance of                            previous anesthesia was also reviewed. The risks                            and benefits of the procedure and the sedation                            options and risks were discussed with the patient.                            All questions were answered, and informed consent                            was obtained. Prior Anticoagulants: The patient has                            taken no anticoagulant or antiplatelet agents. ASA                            Grade Assessment: I - A normal, healthy patient.                            After reviewing the risks and benefits, the patient                            was deemed in satisfactory condition to undergo the                            procedure.                           After obtaining informed consent, the colonoscope  was passed under direct vision. Throughout the                            procedure, the patient's blood pressure, pulse, and                            oxygen saturations were monitored continuously. The                            Olympus Scope [id number] was introduced through the                            anus and advanced to the 2 cm into the ileum. The                            colonoscopy was performed  without difficulty. The                            patient tolerated the procedure well. The quality                            of the bowel preparation was adequate to identify                            polyps. The terminal ileum, ileocecal valve,                            appendiceal orifice, and rectum were photographed.                            Solid stool with vegetable material was noted in                            the cecum and in few areas of the colon. This could                            not be fully washed as it would not clog the                            suction channel of the scope. Overall the                            examination was adequate except for cecum and few                            other areas. Scope In: 9:42:48 AM Scope Out: 9:55:09 AM Scope Withdrawal Time: 0 hours 9 minutes 13 seconds  Total Procedure Duration: 0 hours 12 minutes 21 seconds  Findings:                 A 2 mm polyp was found in the distal sigmoid colon.  The polyp was sessile. The polyp was removed with a                            cold biopsy forceps. Resection and retrieval were                            complete.                           The exam was otherwise normal throughout the                            examined colon.                           The terminal ileum appeared normal.                           Retroflexion in the right colon was performed.                           The exam was otherwise without abnormality on                            direct and retroflexion views. Complications:            No immediate complications. Estimated Blood Loss:     Estimated blood loss: none. Impression:               - One 2 mm polyp in the distal sigmoid colon,                            removed with a cold biopsy forceps. Resected and                            retrieved.                           - The examined portion of the ileum was normal.                            - The examination was otherwise normal on direct                            and retroflexion views.                           - Previous bleeding likely due to anal source. No                            obvious lesions noted. No evidence of IBD. Recommendation:           - Patient has a contact number available for                            emergencies. The signs and symptoms of potential  delayed complications were discussed with the                            patient. Return to normal activities tomorrow.                            Written discharge instructions were provided to the                            patient.                           - Resume previous diet.                           - Miralax 1 capful (17 grams) in 8 ounces of water                            PO daily for 12 weeks then as needed.                           - Preparation H ointment BID prn: Apply externally                            as necessary.                           - Await pathology results.                           - Repeat colonoscopy for surveillance based on                            pathology results.                           - The findings and recommendations were discussed                            with the patient's family. Lynnie Bring, MD 03/03/2024 10:05:08 AM This report has been signed electronically.

## 2024-03-03 NOTE — Op Note (Signed)
 [facility] Patient Name: Morgan Mcguire Procedure Date: 03/03/2024 9:26 AM MRN: [id number] Endoscopist: Lynnie Bring , MD, [id number] Age: 18 Referring MD:  Date of Birth: 28-Nov-2005 Gender: Female Account #: [id number] Procedure:                Upper GI endoscopy Indications:              Epigastric abdominal pain Medicines:                Monitored Anesthesia Care Procedure:                Pre-Anesthesia Assessment:                           - Prior to the procedure, a History and Physical                            was performed, and patient medications and                            allergies were reviewed. The patient's tolerance of                            previous anesthesia was also reviewed. The risks                            and benefits of the procedure and the sedation                            options and risks were discussed with the patient.                            All questions were answered, and informed consent                            was obtained. Prior Anticoagulants: The patient has                            taken no anticoagulant or antiplatelet agents. ASA                            Grade Assessment: I - A normal, healthy patient.                            After reviewing the risks and benefits, the patient                            was deemed in satisfactory condition to undergo the                            procedure.                           After obtaining informed consent, the endoscope was  passed under direct vision. Throughout the                            procedure, the patient's blood pressure, pulse, and                            oxygen saturations were monitored continuously. The                            Olympus Scope [id number] was introduced through the                            mouth, and advanced to the second part of duodenum.                            The upper GI endoscopy was accomplished  without                            difficulty. The patient tolerated the procedure                            well. Scope In: Scope Out: Findings:                 The examined esophagus was normal.                           The Z-line was regular and was found 35 cm from the                            incisors. Examined by NBI.                           The entire examined stomach was normal. Biopsies                            were taken with a cold forceps for histology to                            rule out HP.                           The examined duodenum was normal. Complications:            No immediate complications. Estimated Blood Loss:     Estimated blood loss: none. Impression:               - Normal EGD. Recommendation:           - Patient has a contact number available for                            emergencies. The signs and symptoms of potential                            delayed complications were discussed with the  patient. Return to normal activities tomorrow.                            Written discharge instructions were provided to the                            patient.                           - Resume previous diet.                           - Continue present medications including Protonix                             20 mg p.o. daily x 2 weeks, then as needed.                           - Await pathology results.                           - Proceed with colonoscopy.                           - The findings and recommendations were discussed                            with the patient's family. Lynnie Bring, MD 03/03/2024 9:59:08 AM This report has been signed electronically.

## 2024-03-06 ENCOUNTER — Ambulatory Visit: Admitting: Sports Medicine

## 2024-03-06 ENCOUNTER — Telehealth: Payer: Self-pay

## 2024-03-06 NOTE — Telephone Encounter (Signed)
 Post procedure follow up call, no answer

## 2024-03-08 LAB — SURGICAL PATHOLOGY

## 2024-03-15 ENCOUNTER — Ambulatory Visit
Admission: EM | Admit: 2024-03-15 | Discharge: 2024-03-15 | Disposition: A | Discharge status: Home / Self Care | Attending: Physician Assistant | Admitting: Physician Assistant

## 2024-03-15 ENCOUNTER — Other Ambulatory Visit: Payer: Self-pay

## 2024-03-15 ENCOUNTER — Ambulatory Visit

## 2024-03-15 DIAGNOSIS — N898 Other specified noninflammatory disorders of vagina: Secondary | ICD-10-CM | POA: Diagnosis not present

## 2024-03-15 DIAGNOSIS — R8281 Pyuria: Secondary | ICD-10-CM | POA: Diagnosis not present

## 2024-03-15 LAB — POCT URINE DIPSTICK
Bilirubin, UA: NEGATIVE
Blood, UA: NEGATIVE
Glucose, UA: NEGATIVE mg/dL
Ketones, POC UA: NEGATIVE mg/dL
Nitrite, UA: NEGATIVE
Spec Grav, UA: >=1.03 — AB (ref 1.010–1.025)
Urobilinogen, UA: 0.2 U/dL
pH, UA: 7 (ref 5.0–8.0)

## 2024-03-15 LAB — POCT URINE PREGNANCY: Preg Test, Ur: NEGATIVE

## 2024-03-15 MED ORDER — FLUCONAZOLE 150 MG PO TABS: 150.0000 mg | ORAL_TABLET | ORAL | 0 refills | Status: DC | PRN

## 2024-03-15 NOTE — ED Provider Notes (Signed)
 [facility]    CSN: [id number] Arrival date & time: 03/15/24  0909      History   Chief Complaint Chief Complaint  Patient presents with   Vaginal Discharge    HPI Morgan Mcguire is a 18 y.o. female.   HPI  Pt is here for concerns of vaginal discharge changes- she states it is white in appearance She also reports concerns for vulvovaginal irritation  She denies rashes or lesions. She reports there was some itching in the area and she did have more irritation after scratching it She states her symptoms have been ongoing for about 2 days  She is sexually active with single female partner. She denies that her partner has expressed concerns to her for STD exposure or symptoms  She denies recent abx use    Past Medical History:  Diagnosis Date   Acute ear infection     Patient Active Problem List   Diagnosis Date Noted   Epigastric pain 06/03/2023    Past Surgical History:  Procedure Laterality Date   WISDOM TOOTH EXTRACTION      OB History   No obstetric history on file.      Home Medications    Prior to Admission medications   Medication Sig Start Date End Date Taking? Authorizing Provider  fluconazole  (DIFLUCAN ) 150 MG tablet Take 1 tablet (150 mg total) by mouth every three (3) days as needed. May repeat in 3 days if symptoms not resolved 03/15/24  Yes Dwayne Begay E, PA-C  acetaminophen  (TYLENOL ) 500 MG tablet Take 500 mg by mouth every 6 (six) hours as needed.    [provider]  meloxicam  (MOBIC ) 15 MG tablet Take 1 tablet (15 mg total) by mouth daily. 01/11/24   Leonce Katz, DO  pantoprazole  (PROTONIX ) 20 MG tablet Take 1 tablet (20 mg total) by mouth daily. 02/16/24   Charlanne Groom, MD  tretinoin (RETIN-A) 0.025 % cream Apply 1 Application topically at bedtime. 11/24/23   [provider]    Family History Family History  Problem Relation Age of Onset   Liver disease Neg Hx    Esophageal cancer Neg Hx    Colon cancer  Neg Hx    Rectal cancer Neg Hx    Stomach cancer Neg Hx     Social History Social History   Tobacco Use   Smoking status: Never   Smokeless tobacco: Never  Vaping Use   Vaping status: Never Used  Substance Use Topics   Alcohol use: Never   Drug use: Never     Allergies   Patient has no known allergies.   Review of Systems Review of Systems  Constitutional:  Negative for chills, fatigue and fever.  Gastrointestinal:  Negative for abdominal pain.  Genitourinary:  Positive for vaginal discharge. Negative for dysuria, flank pain, genital sores and pelvic pain.  Skin:  Negative for rash.     Physical Exam Triage Vital Signs ED Triage Vitals  Encounter Vitals Group     BP 03/15/24 1017 119/79     Girls Systolic BP Percentile --      Girls Diastolic BP Percentile --      Boys Systolic BP Percentile --      Boys Diastolic BP Percentile --      Pulse Rate 03/15/24 1017 64     Resp 03/15/24 1017 17     Temp 03/15/24 1017 98.2 F (36.8 C)     Temp Source 03/15/24 1017 Oral  SpO2 03/15/24 1017 98 %     Weight 03/15/24 1019 175 lb (79.4 kg)     Height 03/15/24 1019 5' 8 (1.727 m)     Head Circumference --      Peak Flow --      Pain Score 03/15/24 1018 6     Pain Loc --      Pain Education --      Exclude from Growth Chart --    No data found.  Updated Vital Signs BP 119/79 (BP Location: Right Arm)   Pulse 64   Temp 98.2 F (36.8 C) (Oral)   Resp 17   Ht 5' 8 (1.727 m)   Wt 175 lb (79.4 kg)   LMP 03/01/2024 (Approximate)   SpO2 98%   BMI 26.61 kg/m   Visual Acuity Right Eye Distance:   Left Eye Distance:   Bilateral Distance:    Right Eye Near:   Left Eye Near:    Bilateral Near:     Physical Exam Vitals reviewed.  Constitutional:      General: She is awake.     Appearance: Normal appearance. She is well-developed and well-groomed.  HENT:     Head: Normocephalic and atraumatic.  Eyes:     General: Lids are normal. Gaze aligned  appropriately.     Extraocular Movements: Extraocular movements intact.     Conjunctiva/sclera: Conjunctivae normal.  Pulmonary:     Effort: Pulmonary effort is normal.  Neurological:     Mental Status: She is alert and oriented to person, place, and time.  Psychiatric:        Attention and Perception: Attention and perception normal.        Mood and Affect: Mood and affect normal.        Speech: Speech normal.        Behavior: Behavior normal. Behavior is cooperative.      UC Treatments / Results  Labs (all labs ordered are listed, but only abnormal results are displayed) Labs Reviewed  POCT URINE DIPSTICK - Abnormal; Notable for the following components:      Result Value   Clarity, UA cloudy (*)    Spec Grav, UA >=1.030 (*)    Protein Ur, POC trace (*)    Leukocytes, UA Trace (*)    All other components within normal limits  POCT URINE PREGNANCY - Normal  URINE CULTURE  CERVICOVAGINAL ANCILLARY ONLY    EKG   Radiology No results found.  Procedures Procedures (including critical care time)  Medications Ordered in UC Medications - No data to display  Initial Impression / Assessment and Plan / UC Course  I have reviewed the triage vital signs and the nursing notes.  Pertinent labs & imaging results that were available during my care of the patient were reviewed by me and considered in my medical decision making (see chart for details).      Final Clinical Impressions(s) / UC Diagnoses   Final diagnoses:  Vaginal discharge  Pyuria   Patient presents today with concerns for vulvovaginal discomfort and vaginal discharge changes has been ongoing for about 2 days.  She reports that the genital area has had some slight itching and when she did scratch the area this became more irritated but denies rash, genital lesion, dysuria, suprapubic pain or flank pain.  Urine dip is notable for trace leukocytes which I suspect is likely contamination from vulvovaginal process  but will get urine culture for definitive rule out.  Cervicovaginal swab  collected to assess for BV, yeast, trichomoniasis, gonorrhea, chlamydia.  Based on patient's described symptoms will send in Diflucan  as I suspect she likely has a yeast infection.  Results to dictate further management.  Follow-up as needed for progressing or present symptoms    Discharge Instructions      You are seen today for concerns of vulvovaginal discomfort and vaginal discharge changes.  Your symptoms appear consistent with a likely yeast infection so we have collected a cervicovaginal swab to assess for BV, yeast, trichomonas, gonorrhea and chlamydia.  Your urine was notable for signs of white blood cells which can be consistent with a UTI.  Since you are not having pain with urination I suspect this might be contamination from your vulvovaginal concern so I am sending a sample of your urine off for urine culture for definitive rule out of a UTI or infection.  We will keep you updated with those results once they are available  We will keep you updated on the results of your cervicovaginal swab once the results are available.  If any other medication or treatment is indicated by those results that will be sent into the pharmacy that we have on file. Please make sure that you are practicing safe sex and using barrier methods to prevent exposure. It is recommended to avoid intercourse until you have the results back from testing and have completed any treatments that are sent in for you.   To assist with your symptoms while we are waiting on your test results I have sent in a medication called Diflucan  for you to take once every 72 hours.  This medication treats yeast infections fairly well and has relatively low side effect burden.      ED Prescriptions     Medication Sig Dispense Auth. Provider   fluconazole  (DIFLUCAN ) 150 MG tablet Take 1 tablet (150 mg total) by mouth every three (3) days as needed. May repeat  in 3 days if symptoms not resolved 2 tablet Yaslin Kirtley E, PA-C      PDMP not reviewed this encounter.   Marylene Rocky BRAVO, PA-C 03/15/24 1519

## 2024-03-15 NOTE — ED Triage Notes (Addendum)
 Pt presents with complaints of vaginal discharge and discomfort x 2 days. Currently rates overall pain a 6/10. Denies taking OTC medications PTA. Pt states there is a concern for a STD, only requesting the vaginal swab today. Denies itching.

## 2024-03-15 NOTE — Discharge Instructions (Addendum)
 You are seen today for concerns of vulvovaginal discomfort and vaginal discharge changes.  Your symptoms appear consistent with a likely yeast infection so we have collected a cervicovaginal swab to assess for BV, yeast, trichomonas, gonorrhea and chlamydia.  Your urine was notable for signs of white blood cells which can be consistent with a UTI.  Since you are not having pain with urination I suspect this might be contamination from your vulvovaginal concern so I am sending a sample of your urine off for urine culture for definitive rule out of a UTI or infection.  We will keep you updated with those results once they are available  We will keep you updated on the results of your cervicovaginal swab once the results are available.  If any other medication or treatment is indicated by those results that will be sent into the pharmacy that we have on file. Please make sure that you are practicing safe sex and using barrier methods to prevent exposure. It is recommended to avoid intercourse until you have the results back from testing and have completed any treatments that are sent in for you.   To assist with your symptoms while we are waiting on your test results I have sent in a medication called Diflucan  for you to take once every 72 hours.  This medication treats yeast infections fairly well and has relatively low side effect burden.

## 2024-03-16 ENCOUNTER — Ambulatory Visit (HOSPITAL_COMMUNITY): Payer: Self-pay

## 2024-03-16 ENCOUNTER — Encounter (HOSPITAL_COMMUNITY): Payer: Self-pay

## 2024-03-16 LAB — CERVICOVAGINAL ANCILLARY ONLY
Bacterial Vaginitis (gardnerella): POSITIVE — AB
Candida Glabrata: NEGATIVE
Candida Vaginitis: POSITIVE — AB
Chlamydia: POSITIVE — AB
Comment: NEGATIVE
Comment: NEGATIVE
Comment: NEGATIVE
Comment: NEGATIVE
Comment: NEGATIVE
Comment: NORMAL
Neisseria Gonorrhea: NEGATIVE
Trichomonas: NEGATIVE

## 2024-03-16 MED ORDER — ONDANSETRON 4 MG PO TBDP: 4.0000 mg | ORAL_TABLET | Freq: Three times a day (TID) | ORAL | 0 refills | Status: DC | PRN

## 2024-03-16 MED ORDER — DOXYCYCLINE HYCLATE 100 MG PO TABS: 100.0000 mg | ORAL_TABLET | Freq: Two times a day (BID) | ORAL | 0 refills | Status: AC

## 2024-03-16 MED ORDER — METRONIDAZOLE 500 MG PO TABS: 500.0000 mg | ORAL_TABLET | Freq: Two times a day (BID) | ORAL | 0 refills | Status: DC

## 2024-03-16 MED ORDER — METRONIDAZOLE 0.75 % VA GEL: 1.0000 | Freq: Every day | VAGINAL | 0 refills | Status: AC

## 2024-03-16 NOTE — Telephone Encounter (Signed)
 Pt returned call.  Verified identity using two identifiers.  Provided positive result.  Reviewed safe sex practices, notifying partners, and refraining from sexual activities for 7 days from time of treatment.  Patient verified understanding, all questions answered.

## 2024-03-17 LAB — URINE CULTURE

## 2024-03-20 MED ORDER — DOXYCYCLINE HYCLATE 100 MG PO TABS: 100.0000 mg | ORAL_TABLET | Freq: Two times a day (BID) | ORAL | 0 refills | Status: AC

## 2024-03-20 NOTE — Addendum Note (Signed)
 Addended by: ARNALDO ALFONSO RAMAN on: 03/20/2024 01:51 PM   Modules accepted: Orders

## 2024-03-21 ENCOUNTER — Ambulatory Visit: Payer: Self-pay | Admitting: Gastroenterology

## 2024-03-25 DIAGNOSIS — R3129 Other microscopic hematuria: Secondary | ICD-10-CM | POA: Diagnosis not present

## 2024-03-25 DIAGNOSIS — R0981 Nasal congestion: Secondary | ICD-10-CM | POA: Diagnosis not present

## 2024-03-25 DIAGNOSIS — K5900 Constipation, unspecified: Secondary | ICD-10-CM | POA: Diagnosis not present

## 2024-03-25 DIAGNOSIS — R300 Dysuria: Secondary | ICD-10-CM | POA: Diagnosis not present

## 2024-03-26 ENCOUNTER — Ambulatory Visit

## 2024-04-04 ENCOUNTER — Encounter: Payer: Self-pay | Admitting: Sports Medicine

## 2024-04-11 ENCOUNTER — Ambulatory Visit (INDEPENDENT_AMBULATORY_CARE_PROVIDER_SITE_OTHER): Admitting: Family Medicine

## 2024-04-11 ENCOUNTER — Encounter: Payer: Self-pay | Admitting: Family Medicine

## 2024-04-11 VITALS — BP 118/80 | HR 55 | Temp 98.5°F | Resp 16 | Ht 68.0 in | Wt 178.4 lb

## 2024-04-11 DIAGNOSIS — M25561 Pain in right knee: Secondary | ICD-10-CM
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
## 2024-04-18 ENCOUNTER — Telehealth: Payer: Self-pay | Admitting: Family Medicine

## 2024-04-18 NOTE — Telephone Encounter (Signed)
 Pt dropped off papers to be filled out by pcp. Pts mom may pick up when complete. Left papers in pcps box. Please call pt when papers are ready to be picked up.

## 2024-04-20 ENCOUNTER — Encounter: Payer: Self-pay | Admitting: Family Medicine

## 2024-04-20 NOTE — Telephone Encounter (Signed)
Forms given to pcp.

## 2024-04-25 ENCOUNTER — Encounter: Payer: Self-pay | Admitting: Family Medicine

## 2024-04-26 NOTE — Telephone Encounter (Signed)
 Regarding paperwork that was dropped off on 04/18/24?

## 2024-04-27 ENCOUNTER — Telehealth: Payer: Self-pay | Admitting: Family Medicine

## 2024-04-27 NOTE — Telephone Encounter (Signed)
 Copied from CRM #[id number]. Topic: General - Other >> Apr 27, 2024  4:04 PM Debby BROCKS wrote: Reason for CRM: Patient received a note from her 04/11/2024 visit for her job but now she also needs one for school. If she could be provided one to provide it to them

## 2024-04-28 ENCOUNTER — Other Ambulatory Visit

## 2024-05-01 ENCOUNTER — Ambulatory Visit
Admission: RE | Admit: 2024-05-01 | Discharge: 2024-05-01 | Disposition: A | Discharge status: Home / Self Care | Attending: Physician Assistant

## 2024-05-01 ENCOUNTER — Other Ambulatory Visit: Payer: Self-pay

## 2024-05-01 VITALS — BP 96/68 | HR 60 | Temp 98.1°F | Resp 18 | Ht 68.0 in | Wt 175.0 lb

## 2024-05-01 DIAGNOSIS — R1031 Right lower quadrant pain: Secondary | ICD-10-CM | POA: Insufficient documentation

## 2024-05-01 DIAGNOSIS — Z113 Encounter for screening for infections with a predominantly sexual mode of transmission: Secondary | ICD-10-CM

## 2024-05-01 DIAGNOSIS — R1030 Lower abdominal pain, unspecified: Secondary | ICD-10-CM

## 2024-05-01 DIAGNOSIS — R109 Unspecified abdominal pain: Secondary | ICD-10-CM | POA: Diagnosis not present

## 2024-05-01 DIAGNOSIS — R102 Pelvic and perineal pain: Secondary | ICD-10-CM | POA: Diagnosis not present

## 2024-05-01 DIAGNOSIS — N898 Other specified noninflammatory disorders of vagina: Secondary | ICD-10-CM | POA: Insufficient documentation

## 2024-05-01 LAB — POCT URINE DIPSTICK
Bilirubin, UA: NEGATIVE
Blood, UA: NEGATIVE
Glucose, UA: NEGATIVE mg/dL
Ketones, POC UA: NEGATIVE mg/dL
Leukocytes, UA: NEGATIVE
Nitrite, UA: NEGATIVE
POC PROTEIN,UA: NEGATIVE
Spec Grav, UA: 1.02 (ref 1.010–1.025)
Urobilinogen, UA: 0.2 U/dL
pH, UA: 7 (ref 5.0–8.0)

## 2024-05-01 LAB — POCT URINE PREGNANCY: Preg Test, Ur: NEGATIVE

## 2024-05-01 MED ORDER — CEFTRIAXONE SODIUM 500 MG IJ SOLR
500.0000 mg | INTRAMUSCULAR | Status: DC
  Administered 2024-05-01: 500 mg via INTRAMUSCULAR

## 2024-05-01 MED ORDER — DOXYCYCLINE HYCLATE 100 MG PO CAPS: 100.0000 mg | ORAL_CAPSULE | Freq: Two times a day (BID) | ORAL | 0 refills | Status: AC

## 2024-05-01 MED ORDER — ONDANSETRON 4 MG PO TBDP: 4.0000 mg | ORAL_TABLET | Freq: Three times a day (TID) | ORAL | 0 refills | Status: DC | PRN

## 2024-05-01 NOTE — ED Provider Notes (Signed)
 [facility]    CSN: [id number] Arrival date & time: 05/01/24  1155      History   Chief Complaint Chief Complaint  Patient presents with   Abdominal Pain    Check for sti no blood work - Entered by patient   SEXUALLY TRANSMITTED DISEASE    HPI Mackensi Mahadeo is a 18 y.o. female.  has a past medical history of Acute ear infection.   HPI Discussed the use of AI scribe software for clinical note transcription with the patient, who gave verbal consent to proceed.  The patient presents with abdominal pain and concerns about a possible STI.  The patient has been experiencing abdominal pain for the past five days, described as shooting from the umbilical region and sometimes radiating to the right pelvic area. The pain is intermittent and is rated as six out of ten during these episodes. Currently, she does not feel any pain. There is no fever, chills, nausea, vomiting, diarrhea, hematochezia, or flank pain. No swelling or drainage around the umbilicus, although she noticed a slight discharge previously from her new belly button piercing.   There is a slight increase in vaginal discharge without significant changes in its nature. She is sexually active with one primary partner and had a recent encounter with a different partner, raising concerns about a possible STI. No genital sores or rashes have been noted. She has not experienced any symptoms of STDs from her partner.  She has a history of taking Keflex  for a previous condition and is not allergic to any medications. She is concerned about potential side effects of new medications, particularly regarding gastrointestinal irritation and photosensitivity.   Past Medical History:  Diagnosis Date   Acute ear infection     Patient Active Problem List   Diagnosis Date Noted   Epigastric pain 06/03/2023    Past Surgical History:  Procedure Laterality Date   WISDOM TOOTH EXTRACTION      OB History   No obstetric  history on file.      Home Medications    Prior to Admission medications   Medication Sig Start Date End Date Taking? Authorizing Provider  doxycycline  (VIBRAMYCIN ) 100 MG capsule Take 1 capsule (100 mg total) by mouth 2 (two) times daily for 7 days. 05/01/24 05/08/24 Yes Mahalie Kanner E, PA-C  ondansetron  (ZOFRAN -ODT) 4 MG disintegrating tablet Take 1 tablet (4 mg total) by mouth every 8 (eight) hours as needed for nausea or vomiting. 05/01/24  Yes Jnya Brossard E, PA-C  acetaminophen  (TYLENOL ) 500 MG tablet Take 500 mg by mouth every 6 (six) hours as needed.    [provider]  fluconazole  (DIFLUCAN ) 150 MG tablet Take 1 tablet (150 mg total) by mouth every three (3) days as needed. May repeat in 3 days if symptoms not resolved 03/15/24   Gaia Gullikson E, PA-C  meloxicam  (MOBIC ) 15 MG tablet Take 1 tablet (15 mg total) by mouth daily. 01/11/24   Leonce Katz, DO  ondansetron  (ZOFRAN -ODT) 4 MG disintegrating tablet Take 1 tablet (4 mg total) by mouth every 8 (eight) hours as needed for nausea or vomiting. 03/16/24   Dreama, [place]  N, FNP  pantoprazole  (PROTONIX ) 20 MG tablet Take 1 tablet (20 mg total) by mouth daily. 02/16/24   Charlanne Groom, MD  tretinoin (RETIN-A) 0.025 % cream Apply 1 Application topically at bedtime. 11/24/23   [provider]    Family History Family History  Problem Relation Age of Onset   Liver disease Neg  Hx    Esophageal cancer Neg Hx    Colon cancer Neg Hx    Rectal cancer Neg Hx    Stomach cancer Neg Hx     Social History Social History   Tobacco Use   Smoking status: Never   Smokeless tobacco: Never  Vaping Use   Vaping status: Never Used  Substance Use Topics   Alcohol use: Never   Drug use: Never     Allergies   Patient has no known allergies.   Review of Systems Review of Systems  Constitutional:  Negative for chills and fever.  Gastrointestinal:  Positive for abdominal pain (RLQ and periumbilical pain). Negative for  blood in stool, diarrhea, nausea and vomiting.  Genitourinary:  Positive for vaginal discharge. Negative for dysuria, flank pain, genital sores, vaginal bleeding and vaginal pain.  Skin:  Negative for rash.     Physical Exam Triage Vital Signs ED Triage Vitals  Encounter Vitals Group     BP 05/01/24 1221 96/68     Girls Systolic BP Percentile --      Girls Diastolic BP Percentile --      Boys Systolic BP Percentile --      Boys Diastolic BP Percentile --      Pulse Rate 05/01/24 1221 60     Resp 05/01/24 1221 18     Temp 05/01/24 1221 98.1 F (36.7 C)     Temp Source 05/01/24 1221 Oral     SpO2 05/01/24 1221 98 %     Weight 05/01/24 1219 175 lb (79.4 kg)     Height 05/01/24 1219 5' 8 (1.727 m)     Head Circumference --      Peak Flow --      Pain Score 05/01/24 1219 0     Pain Loc --      Pain Education --      Exclude from Growth Chart --    No data found.  Updated Vital Signs BP 96/68 (BP Location: Right Arm)   Pulse 60   Temp 98.1 F (36.7 C) (Oral)   Resp 18   Ht 5' 8 (1.727 m)   Wt 175 lb (79.4 kg)   LMP 04/17/2024 (Exact Date)   SpO2 98%   BMI 26.61 kg/m   Visual Acuity Right Eye Distance:   Left Eye Distance:   Bilateral Distance:    Right Eye Near:   Left Eye Near:    Bilateral Near:     Physical Exam Vitals reviewed.  Constitutional:      General: She is awake. She is not in acute distress.    Appearance: Normal appearance. She is well-developed and well-groomed. She is not ill-appearing, toxic-appearing or diaphoretic.  HENT:     Head: Normocephalic and atraumatic.  Eyes:     General: Lids are normal. Gaze aligned appropriately.     Extraocular Movements: Extraocular movements intact.     Conjunctiva/sclera: Conjunctivae normal.  Pulmonary:     Effort: Pulmonary effort is normal.  Abdominal:     General: Abdomen is flat. Bowel sounds are normal.     Palpations: Abdomen is soft.     Tenderness: There is abdominal tenderness in the right  lower quadrant, suprapubic area and left lower quadrant. There is rebound. There is no right CVA tenderness, left CVA tenderness or guarding. Negative signs include Murphy's sign and McBurney's sign.     Hernia: There is no hernia in the umbilical area or ventral area.  Comments: Pt has umbilical piercing present. There is mild redness noted along the upper portion of the piercing but this appears more consistent with granulation tissue than superficial infection. No drainage, swelling, bleeding or warmth to the area.   Neurological:     Mental Status: She is alert and oriented to person, place, and time.  Psychiatric:        Attention and Perception: Attention and perception normal.        Mood and Affect: Mood and affect normal.        Speech: Speech normal.        Behavior: Behavior normal. Behavior is cooperative.      UC Treatments / Results  Labs (all labs ordered are listed, but only abnormal results are displayed) Labs Reviewed  POCT URINE DIPSTICK - Abnormal; Notable for the following components:      Result Value   Clarity, UA cloudy (*)    All other components within normal limits  POCT URINE PREGNANCY  CERVICOVAGINAL ANCILLARY ONLY    EKG   Radiology No results found.  Procedures Procedures (including critical care time)  Medications Ordered in UC Medications  cefTRIAXone (ROCEPHIN) injection 500 mg (500 mg Intramuscular Given 05/01/24 1255)    Initial Impression / Assessment and Plan / UC Course  I have reviewed the triage vital signs and the nursing notes.  Pertinent labs & imaging results that were available during my care of the patient were reviewed by me and considered in my medical decision making (see chart for details).      Final Clinical Impressions(s) / UC Diagnoses   Final diagnoses:  Lower abdominal pain  Screening examination for STD (sexually transmitted disease)   Suspected pelvic inflammatory disease secondary to sexually  transmitted infection Suspected pelvic inflammatory disease due to recent sexual activity with a new partner. Bilateral tenderness raises suspicion for gonorrhea or chlamydia infection. Negative pregnancy test. No fever or chills reported. - Administer ceftriaxone injection - Prescribe doxycycline  - Advise use of probiotics and yogurt to mitigate stomach irritation from antibiotics - Educate on potential side effects of antibiotics, including tenderness at injection site, stomach irritation, and increased sensitivity to sunlight - Advise use of sunscreen and protective clothing during and for one week after doxycycline  treatment  Right lower quadrant abdominal pain/ LLQ and suprapubic pain Intermittent right lower quadrant abdominal pain for five days, with tenderness on palpation and pain exacerbated upon release. No associated fever, chills, nausea, vomiting, diarrhea, or urinary symptoms. Differential includes pelvic inflammatory disease, UTI, BV, appendicitis. Less suspicious of appendicitis due to limited symptoms and pt is afebrile. UTI is less likely with clear UA.   Increased vaginal discharge Increased vaginal discharge, possibly related to suspected sexually transmitted infection. No sores or rashes in the genital area.  Follow-up Follow-up plan discussed to update on test results once available. - Update on test results once available    Discharge Instructions      VISIT SUMMARY:  You came in today with concerns about abdominal pain and a possible sexually transmitted infection (STI). You have been experiencing intermittent abdominal pain for the past five days, which sometimes radiates to your right pelvic area. You also noticed a slight increase in vaginal discharge. You are sexually active with one primary partner and had a recent encounter with a different partner, which raised your concerns about a possible STI. There are no other significant symptoms such as fever, chills,  nausea, or vomiting.  YOUR PLAN:  -SUSPECTED PELVIC INFLAMMATORY  DISEASE SECONDARY TO SEXUALLY TRANSMITTED INFECTION: Pelvic inflammatory disease (PID) is an infection of the female reproductive organs, often caused by sexually transmitted bacteria. Given your recent sexual activity with a new partner, we suspect this might be the cause of your symptoms. We administered a ceftriaxone injection and prescribed doxycycline  to treat the infection. To help with potential stomach irritation from the antibiotics, we recommend taking probiotics and eating yogurt. Be aware of possible side effects like tenderness at the injection site, stomach irritation, and increased sensitivity to sunlight. Use sunscreen and protective clothing during and for one week after your doxycycline  treatment.  -RIGHT LOWER QUADRANT ABDOMINAL PAIN: Your intermittent right lower quadrant abdominal pain could be related to the suspected pelvic inflammatory disease. There are no other associated symptoms like fever, chills, nausea, or vomiting, which is reassuring. If your symptoms are not improving or seem to be worsening please return to urgent care or seek assistance at the ED.   -INCREASED VAGINAL DISCHARGE: The increase in vaginal discharge may be related to the suspected sexually transmitted infection. There are no sores or rashes in the genital area, which is a good sign.  INSTRUCTIONS:  We will follow up with you to update you on your test results once they are available.  If you do start to develop more significant symptoms such as the following please go to the emergency room: Severe abdominal pain, fever and chills that are not responding to Tylenol  and ibuprofen , nausea and vomiting preventing you from eating or drinking, vaginal bleeding or discharge     ED Prescriptions     Medication Sig Dispense Auth. Provider   doxycycline  (VIBRAMYCIN ) 100 MG capsule Take 1 capsule (100 mg total) by mouth 2 (two) times daily for 7  days. 14 capsule Tonnette Zwiebel E, PA-C   ondansetron  (ZOFRAN -ODT) 4 MG disintegrating tablet Take 1 tablet (4 mg total) by mouth every 8 (eight) hours as needed for nausea or vomiting. 20 tablet Zaeda Mcferran E, PA-C      PDMP not reviewed this encounter.   Marylene Rocky BRAVO, PA-C 05/01/24 1257

## 2024-05-01 NOTE — Discharge Instructions (Signed)
 VISIT SUMMARY:  You came in today with concerns about abdominal pain and a possible sexually transmitted infection (STI). You have been experiencing intermittent abdominal pain for the past five days, which sometimes radiates to your right pelvic area. You also noticed a slight increase in vaginal discharge. You are sexually active with one primary partner and had a recent encounter with a different partner, which raised your concerns about a possible STI. There are no other significant symptoms such as fever, chills, nausea, or vomiting.  YOUR PLAN:  -SUSPECTED PELVIC INFLAMMATORY DISEASE SECONDARY TO SEXUALLY TRANSMITTED INFECTION: Pelvic inflammatory disease (PID) is an infection of the female reproductive organs, often caused by sexually transmitted bacteria. Given your recent sexual activity with a new partner, we suspect this might be the cause of your symptoms. We administered a ceftriaxone injection and prescribed doxycycline  to treat the infection. To help with potential stomach irritation from the antibiotics, we recommend taking probiotics and eating yogurt. Be aware of possible side effects like tenderness at the injection site, stomach irritation, and increased sensitivity to sunlight. Use sunscreen and protective clothing during and for one week after your doxycycline  treatment.  -RIGHT LOWER QUADRANT ABDOMINAL PAIN: Your intermittent right lower quadrant abdominal pain could be related to the suspected pelvic inflammatory disease. There are no other associated symptoms like fever, chills, nausea, or vomiting, which is reassuring. If your symptoms are not improving or seem to be worsening please return to urgent care or seek assistance at the ED.   -INCREASED VAGINAL DISCHARGE: The increase in vaginal discharge may be related to the suspected sexually transmitted infection. There are no sores or rashes in the genital area, which is a good sign.  INSTRUCTIONS:  We will follow up with you  to update you on your test results once they are available.  If you do start to develop more significant symptoms such as the following please go to the emergency room: Severe abdominal pain, fever and chills that are not responding to Tylenol  and ibuprofen , nausea and vomiting preventing you from eating or drinking, vaginal bleeding or discharge

## 2024-05-01 NOTE — ED Triage Notes (Signed)
 Pt presents with a chief complaint of intermittent right-sided abdominal pain x 4 days. Describes as cramping and shooting pains. States she recently had sexual intercourse with a new partner. Currently denies pain. No medications taken PTA. Denies n/v/d. Requesting STD testing, no blood work.

## 2024-05-02 LAB — CERVICOVAGINAL ANCILLARY ONLY
Bacterial Vaginitis (gardnerella): NEGATIVE
Candida Glabrata: NEGATIVE
Candida Vaginitis: NEGATIVE
Chlamydia: POSITIVE — AB
Comment: NEGATIVE
Comment: NEGATIVE
Comment: NEGATIVE
Comment: NEGATIVE
Comment: NEGATIVE
Comment: NORMAL
Neisseria Gonorrhea: NEGATIVE
Trichomonas: NEGATIVE

## 2024-05-03 ENCOUNTER — Ambulatory Visit (HOSPITAL_COMMUNITY): Payer: Self-pay

## 2024-05-22 DIAGNOSIS — N760 Acute vaginitis: Secondary | ICD-10-CM | POA: Diagnosis not present

## 2024-05-22 DIAGNOSIS — N771 Vaginitis, vulvitis and vulvovaginitis in diseases classified elsewhere: Secondary | ICD-10-CM | POA: Diagnosis not present

## 2024-05-22 DIAGNOSIS — A749 Chlamydial infection, unspecified: Secondary | ICD-10-CM | POA: Diagnosis not present

## 2024-05-22 DIAGNOSIS — Z113 Encounter for screening for infections with a predominantly sexual mode of transmission: Secondary | ICD-10-CM | POA: Diagnosis not present

## 2024-05-26 ENCOUNTER — Emergency Department (HOSPITAL_BASED_OUTPATIENT_CLINIC_OR_DEPARTMENT_OTHER): Admitting: Radiology

## 2024-05-26 ENCOUNTER — Emergency Department (HOSPITAL_BASED_OUTPATIENT_CLINIC_OR_DEPARTMENT_OTHER)
Admission: EM | Admit: 2024-05-26 | Discharge: 2024-05-26 | Disposition: A | Discharge status: Home / Self Care | Attending: Emergency Medicine | Admitting: Emergency Medicine

## 2024-05-26 ENCOUNTER — Other Ambulatory Visit: Payer: Self-pay

## 2024-05-26 DIAGNOSIS — S060X0A Concussion without loss of consciousness, initial encounter: Secondary | ICD-10-CM | POA: Insufficient documentation

## 2024-05-26 DIAGNOSIS — Y92410 Unspecified street and highway as the place of occurrence of the external cause: Secondary | ICD-10-CM | POA: Diagnosis not present

## 2024-05-26 DIAGNOSIS — S0990XA Unspecified injury of head, initial encounter: Secondary | ICD-10-CM | POA: Diagnosis not present

## 2024-05-26 DIAGNOSIS — M25561 Pain in right knee: Secondary | ICD-10-CM | POA: Diagnosis not present

## 2024-05-26 DIAGNOSIS — S8391XA Sprain of unspecified site of right knee, initial encounter: Secondary | ICD-10-CM | POA: Insufficient documentation

## 2024-05-26 NOTE — ED Notes (Addendum)
 Pt given discharge instructions and reviewed follow-up instructions. Opportunities given for questions. Pt verbalizes understanding. Bethena Powell SAUNDERS, RN
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
## 2024-05-26 NOTE — ED Notes (Signed)
 Patient c/o 4/10 knee pain at discharge.

## 2024-05-26 NOTE — Discharge Instructions (Signed)
 You need to rest over the next week, brain rest includes no staring at screens or phones.  Also no intense exercise.  Make sure you are drinking plenty of fluids and getting lots of rest

## 2024-06-12 ENCOUNTER — Ambulatory Visit
Admission: EM | Admit: 2024-06-12 | Discharge: 2024-06-12 | Disposition: A | Discharge status: Home / Self Care | Attending: Emergency Medicine | Admitting: Emergency Medicine

## 2024-06-12 ENCOUNTER — Other Ambulatory Visit: Payer: Self-pay

## 2024-06-12 DIAGNOSIS — Z113 Encounter for screening for infections with a predominantly sexual mode of transmission: Secondary | ICD-10-CM | POA: Diagnosis not present

## 2024-06-12 DIAGNOSIS — R350 Frequency of micturition: Secondary | ICD-10-CM | POA: Diagnosis not present

## 2024-06-12 DIAGNOSIS — N898 Other specified noninflammatory disorders of vagina: Secondary | ICD-10-CM | POA: Insufficient documentation

## 2024-06-12 LAB — POCT URINE DIPSTICK
Bilirubin, UA: NEGATIVE
Blood, UA: NEGATIVE
Glucose, UA: NEGATIVE mg/dL
Ketones, POC UA: NEGATIVE mg/dL
Leukocytes, UA: NEGATIVE
Nitrite, UA: NEGATIVE
POC PROTEIN,UA: NEGATIVE
Spec Grav, UA: 1.025 (ref 1.010–1.025)
Urobilinogen, UA: 0.2 U/dL
pH, UA: 5.5 (ref 5.0–8.0)

## 2024-06-12 NOTE — ED Triage Notes (Addendum)
 Pt presents with complaints of urinary frequency x 1 day and vaginal odor x 2-3 weeks. No pain, only voices discomfort and itching in vaginal area. Pt was recently seen by [facility] on 10/20. Was prescribed Flagyl  this visit. Missed few doses of medication. Symptoms initially improved however odor is still lingering on. LMP 06/06/24.

## 2024-06-12 NOTE — ED Provider Notes (Addendum)
 [facility]    CSN: [id number] Arrival date & time: 06/12/24  1403      History   Chief Complaint Chief Complaint  Patient presents with   Urinary Frequency   Vaginal Odor    HPI Morgan Mcguire is a 18 y.o. female.  1 day urinary frequency Not having dysuria, hematuria, abdominal pain, flank pain, fever  Also vaginal irritation for 2 to 3 weeks.  Odor, not itching She was seen by OB/GYN on 10/20, given Flagyl  for positive BV.  Negative gonorrhea and chlamydia. She reports she missed 2 doses of the Flagyl . Symptoms never went away  LMP 11/4   Past Medical History:  Diagnosis Date   Acute ear infection     Patient Active Problem List   Diagnosis Date Noted   Epigastric pain 06/03/2023    Past Surgical History:  Procedure Laterality Date   WISDOM TOOTH EXTRACTION      OB History   No obstetric history on file.      Home Medications    Prior to Admission medications   Medication Sig Start Date End Date Taking? Authorizing Provider  pantoprazole  (PROTONIX ) 20 MG tablet Take 1 tablet (20 mg total) by mouth daily. 02/16/24   Charlanne Groom, MD  tretinoin (RETIN-A) 0.025 % cream Apply 1 Application topically at bedtime. 11/24/23   [provider]    Family History Family History  Problem Relation Age of Onset   Liver disease Neg Hx    Esophageal cancer Neg Hx    Colon cancer Neg Hx    Rectal cancer Neg Hx    Stomach cancer Neg Hx     Social History Social History   Tobacco Use   Smoking status: Never   Smokeless tobacco: Never  Vaping Use   Vaping status: Never Used  Substance Use Topics   Alcohol use: Never   Drug use: Never     Allergies   Patient has no known allergies.   Review of Systems Review of Systems As per HPI  Physical Exam Triage Vital Signs ED Triage Vitals [06/12/24 1415]  Encounter Vitals Group     BP 131/71     Girls Systolic BP Percentile      Girls Diastolic BP Percentile      Boys  Systolic BP Percentile      Boys Diastolic BP Percentile      Pulse Rate 66     Resp 16     Temp 98.2 F (36.8 C)     Temp Source Oral     SpO2 96 %     Weight 175 lb (79.4 kg)     Height 5' 8 (1.727 m)     Head Circumference      Peak Flow      Pain Score      Pain Loc      Pain Education      Exclude from Growth Chart    No data found.  Updated Vital Signs BP 131/71 (BP Location: Right Arm)   Pulse 66   Temp 98.2 F (36.8 C) (Oral)   Resp 16   Ht 5' 8 (1.727 m)   Wt 175 lb (79.4 kg)   LMP 06/06/2024 (Exact Date)   SpO2 96%   BMI 26.61 kg/m   Visual Acuity Right Eye Distance:   Left Eye Distance:   Bilateral Distance:    Right Eye Near:   Left Eye Near:    Bilateral Near:  Physical Exam Vitals and nursing note reviewed.  Constitutional:      General: She is not in acute distress.    Appearance: Normal appearance.  HENT:     Mouth/Throat:     Mouth: Mucous membranes are moist.     Pharynx: Oropharynx is clear.  Eyes:     Conjunctiva/sclera: Conjunctivae normal.     Pupils: Pupils are equal, round, and reactive to light.  Cardiovascular:     Rate and Rhythm: Normal rate and regular rhythm.     Pulses: Normal pulses.     Heart sounds: Normal heart sounds.  Pulmonary:     Effort: Pulmonary effort is normal.     Breath sounds: Normal breath sounds.  Abdominal:     General: Bowel sounds are normal.     Palpations: Abdomen is soft.     Tenderness: There is no abdominal tenderness. There is no right CVA tenderness or left CVA tenderness.  Neurological:     Mental Status: She is alert and oriented to person, place, and time.      UC Treatments / Results  Labs (all labs ordered are listed, but only abnormal results are displayed) Labs Reviewed  POCT URINE DIPSTICK - Abnormal; Notable for the following components:      Result Value   Clarity, UA hazy (*)    All other components within normal limits  CERVICOVAGINAL ANCILLARY ONLY     EKG   Radiology No results found.  Procedures Procedures (including critical care time)  Medications Ordered in UC Medications - No data to display  Initial Impression / Assessment and Plan / UC Course  I have reviewed the triage vital signs and the nursing notes.  Pertinent labs & imaging results that were available during my care of the patient were reviewed by me and considered in my medical decision making (see chart for details).  UA negative for infection Cytology swab pending. Treat positive result as indicated No questions at this time, agrees to plan Will follow with ob/gyn   Final Clinical Impressions(s) / UC Diagnoses   Final diagnoses:  Urinary frequency  Vaginal odor  Screen for STD (sexually transmitted disease)     Discharge Instructions      We will call you if anything on your swab returns positive. You can also see these results on MyChart. Please abstain from sexual intercourse until your results return.      ED Prescriptions   None    PDMP not reviewed this encounter.   Kaemon Barnett, [place] RIGGERS 06/12/24 1512    Tracy Kinner, [place], PA-C 06/12/24 1513

## 2024-06-12 NOTE — ED Notes (Signed)
Pt unable to provide urine sample at this time.  Provided pt with water.

## 2024-06-12 NOTE — ED Notes (Signed)
 Pt re attempting to provide urine sample at this time.

## 2024-06-12 NOTE — Discharge Instructions (Signed)
 We will call you if anything on your swab returns positive. You can also see these results on MyChart. Please abstain from sexual intercourse until your results return.

## 2024-06-13 ENCOUNTER — Encounter: Payer: Self-pay | Admitting: Family Medicine

## 2024-06-13 ENCOUNTER — Ambulatory Visit: Admitting: Family Medicine

## 2024-06-13 ENCOUNTER — Ambulatory Visit (HOSPITAL_BASED_OUTPATIENT_CLINIC_OR_DEPARTMENT_OTHER): Payer: Self-pay

## 2024-06-13 VITALS — BP 98/62 | HR 60 | Temp 98.6°F | Resp 14 | Ht 68.0 in | Wt 173.2 lb

## 2024-06-13 DIAGNOSIS — S0990XA Unspecified injury of head, initial encounter: Secondary | ICD-10-CM

## 2024-06-13 DIAGNOSIS — S060X0A Concussion without loss of consciousness, initial encounter: Secondary | ICD-10-CM

## 2024-06-13 LAB — CERVICOVAGINAL ANCILLARY ONLY
Bacterial Vaginitis (gardnerella): POSITIVE — AB
Candida Glabrata: NEGATIVE
Candida Vaginitis: NEGATIVE
Chlamydia: NEGATIVE
Comment: NEGATIVE
Comment: NEGATIVE
Comment: NEGATIVE
Comment: NEGATIVE
Comment: NEGATIVE
Comment: NORMAL
Neisseria Gonorrhea: NEGATIVE
Trichomonas: NEGATIVE

## 2024-06-13 MED ORDER — METRONIDAZOLE 500 MG PO TABS: 500.0000 mg | ORAL_TABLET | Freq: Two times a day (BID) | ORAL | 0 refills | Status: AC
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
## 2024-06-20 ENCOUNTER — Encounter: Payer: Self-pay | Admitting: Family Medicine

## 2024-06-21 ENCOUNTER — Other Ambulatory Visit: Payer: Self-pay | Admitting: Family Medicine

## 2024-06-21 DIAGNOSIS — R1013 Epigastric pain: Secondary | ICD-10-CM

## 2024-06-22 DIAGNOSIS — B3731 Acute candidiasis of vulva and vagina: Secondary | ICD-10-CM | POA: Diagnosis not present

## 2024-06-22 DIAGNOSIS — N899 Noninflammatory disorder of vagina, unspecified: Secondary | ICD-10-CM | POA: Diagnosis not present

## 2024-06-22 DIAGNOSIS — Z113 Encounter for screening for infections with a predominantly sexual mode of transmission: Secondary | ICD-10-CM | POA: Diagnosis not present

## 2024-07-23 ENCOUNTER — Ambulatory Visit (INDEPENDENT_AMBULATORY_CARE_PROVIDER_SITE_OTHER)
Admission: RE | Admit: 2024-07-23 | Discharge: 2024-07-23 | Disposition: A | Discharge status: Home / Self Care | Source: Ambulatory Visit

## 2024-07-23 ENCOUNTER — Other Ambulatory Visit: Payer: Self-pay

## 2024-07-23 ENCOUNTER — Encounter (HOSPITAL_BASED_OUTPATIENT_CLINIC_OR_DEPARTMENT_OTHER): Payer: Self-pay

## 2024-07-23 ENCOUNTER — Emergency Department (HOSPITAL_BASED_OUTPATIENT_CLINIC_OR_DEPARTMENT_OTHER)
Admission: EM | Admit: 2024-07-23 | Discharge: 2024-07-23 | Disposition: A | Discharge status: Home / Self Care | Attending: Emergency Medicine | Admitting: Emergency Medicine

## 2024-07-23 VITALS — BP 131/87 | HR 68 | Temp 98.2°F | Resp 17

## 2024-07-23 DIAGNOSIS — Z113 Encounter for screening for infections with a predominantly sexual mode of transmission: Secondary | ICD-10-CM | POA: Insufficient documentation

## 2024-07-23 DIAGNOSIS — J028 Acute pharyngitis due to other specified organisms: Secondary | ICD-10-CM | POA: Insufficient documentation

## 2024-07-23 DIAGNOSIS — R059 Cough, unspecified: Secondary | ICD-10-CM | POA: Diagnosis present

## 2024-07-23 DIAGNOSIS — B9789 Other viral agents as the cause of diseases classified elsewhere: Secondary | ICD-10-CM | POA: Insufficient documentation

## 2024-07-23 DIAGNOSIS — J069 Acute upper respiratory infection, unspecified: Secondary | ICD-10-CM | POA: Diagnosis not present

## 2024-07-23 DIAGNOSIS — J029 Acute pharyngitis, unspecified: Secondary | ICD-10-CM

## 2024-07-23 DIAGNOSIS — N898 Other specified noninflammatory disorders of vagina: Secondary | ICD-10-CM | POA: Diagnosis present

## 2024-07-23 LAB — POCT URINE DIPSTICK
Bilirubin, UA: NEGATIVE
Blood, UA: NEGATIVE
Glucose, UA: NEGATIVE mg/dL
Ketones, POC UA: NEGATIVE mg/dL
Leukocytes, UA: NEGATIVE
Nitrite, UA: NEGATIVE
POC PROTEIN,UA: NEGATIVE
Spec Grav, UA: 1.025
Urobilinogen, UA: 0.2 U/dL
pH, UA: 8.5 — AB

## 2024-07-23 LAB — POCT URINE PREGNANCY: Preg Test, Ur: NEGATIVE

## 2024-07-23 LAB — RESP PANEL BY RT-PCR (RSV, FLU A&B, COVID)  RVPGX2
Influenza A by PCR: NEGATIVE
Influenza B by PCR: NEGATIVE
Resp Syncytial Virus by PCR: NEGATIVE
SARS Coronavirus 2 by RT PCR: NEGATIVE

## 2024-07-23 LAB — GROUP A STREP BY PCR: Group A Strep by PCR: NOT DETECTED

## 2024-07-23 MED ORDER — PREDNISONE 20 MG PO TABS
40.0000 mg | ORAL_TABLET | Freq: Once | ORAL | Status: AC
  Administered 2024-07-23: 40 mg via ORAL
  Filled 2024-07-23: qty 2

## 2024-07-23 MED ORDER — PREDNISONE 10 MG PO TABS: ORAL_TABLET | ORAL | 0 refills | Status: AC

## 2024-07-23 MED ORDER — FLUCONAZOLE 150 MG PO TABS: 150.0000 mg | ORAL_TABLET | Freq: Every day | ORAL | 0 refills | Status: AC

## 2024-07-23 MED ORDER — LIDOCAINE VISCOUS HCL 2 % MT SOLN
15.0000 mL | Freq: Once | OROMUCOSAL | Status: AC
  Administered 2024-07-23: 15 mL via OROMUCOSAL
  Filled 2024-07-23: qty 15

## 2024-07-23 MED ORDER — LIDOCAINE VISCOUS HCL 2 % MT SOLN: 15.0000 mL | OROMUCOSAL | 0 refills | Status: AC | PRN

## 2024-07-23 NOTE — ED Provider Notes (Signed)
 " [place] [facility] Provider Note   CSN: [id number] Arrival date & time: 07/23/24  9165     Patient presents with: Sore Throat   Morgan Mcguire is a 18 y.o. female with no significant past medical history presents with concern for productive cough with green phlegm, body aches, congestion, and sore throat that began about 1 week ago.  Reports that the sore throat seems to be getting worse and the pain is making it difficult to swallow.  Denies any feelings of obstruction when swallowing.  Denies any shortness of breath.  No recent dental work.  No fever or chills.  She has been trying Tylenol  at home for symptoms without improvement.    Sore Throat       Prior to Admission medications  Medication Sig Start Date End Date Taking? Authorizing Provider  lidocaine  (XYLOCAINE ) 2 % solution Use as directed 15 mLs in the mouth or throat every 4 (four) hours as needed for mouth pain (Throat pain). 07/23/24  Yes Veta Palma, PA-C  predniSONE  (DELTASONE ) 10 MG tablet Take 4 tablets (40 mg total) by mouth daily with breakfast for 1 day, THEN 3 tablets (30 mg total) daily with breakfast for 2 days, THEN 2 tablets (20 mg total) daily with breakfast for 2 days, THEN 1 tablet (10 mg total) daily with breakfast for 1 day. 07/23/24 07/29/24 Yes Veta Palma, PA-C  pantoprazole  (PROTONIX ) 20 MG tablet Take 1 tablet (20 mg total) by mouth daily. 02/16/24   Charlanne Groom, MD  tretinoin (RETIN-A) 0.025 % cream Apply 1 Application topically at bedtime. 11/24/23   [provider]    Allergies: Ibuprofen     Review of Systems  HENT:  Positive for sore throat.   Respiratory:  Positive for cough.     Updated Vital Signs BP 125/89 (BP Location: Right Arm)   Pulse 79   Temp 99 F (37.2 C) (Oral)   Resp 18   SpO2 100%   Physical Exam Vitals and nursing note reviewed.  Constitutional:      General: She is not in acute distress.    Appearance:  She is well-developed.  HENT:     Head: Normocephalic and atraumatic.     Mouth/Throat:     Pharynx: No oropharyngeal exudate or posterior oropharyngeal erythema.     Comments: No erythema or edema of the posterior oropharynx.  No tonsillar exudate.  No peritonsillar or dental abscesses.  Patient swallowing secretions without difficulty. Eyes:     Conjunctiva/sclera: Conjunctivae normal.  Neck:     Comments: Neck is soft and supple without overlying skin change. Cardiovascular:     Rate and Rhythm: Normal rate and regular rhythm.     Heart sounds: No murmur heard. Pulmonary:     Effort: Pulmonary effort is normal. No respiratory distress.     Breath sounds: Normal breath sounds.  Abdominal:     Palpations: Abdomen is soft.     Tenderness: There is no abdominal tenderness.  Musculoskeletal:        General: No swelling.     Cervical back: Neck supple.  Skin:    General: Skin is warm and dry.     Capillary Refill: Capillary refill takes less than 2 seconds.  Neurological:     Mental Status: She is alert.  Psychiatric:        Mood and Affect: Mood normal.     (all labs ordered are listed, but only abnormal results are displayed) Labs  Reviewed  RESP PANEL BY RT-PCR (RSV, FLU A&B, COVID)  [id number]  GROUP A STREP BY PCR  CULTURE, GROUP A STREP [facility])    EKG: None  Radiology: No results found.   Procedures   Medications Ordered in the ED  lidocaine  (XYLOCAINE ) 2 % viscous mouth solution 15 mL (15 mLs Mouth/Throat Given 07/23/24 1033)  predniSONE  (DELTASONE ) tablet 40 mg (40 mg Oral Given 07/23/24 1034)    Clinical Course as of 07/23/24 1047  Sun Jul 23, 2024  1019 Patient p.o. challenged with apple juice and tolerated this well. [AF]    Clinical Course User Index [AF] Veta Palma, PA-C                                 Medical Decision Making Risk Prescription drug management.     Differential diagnosis includes but is not limited to COVID, flu, RSV,  viral URI, strep pharyngitis, viral pharyngitis, allergic rhinitis, pneumonia, bronchitis   ED Course:  Upon initial evaluation, patient is well-appearing, no acute distress.  Normal vital signs.  Reporting throat pain, but posterior pharynx not significantly erythematous.  No tonsillar exudate.  Swallowing without difficulty.  No visualized peritonsillar or dental abscesses.  Neck is soft and supple, no overlying skin change, no concern for ludwig angina.   Labs Ordered: I Ordered, and personally interpreted labs.  The pertinent results include:   COVID, flu, RSV, strep negative Strep culture in process  Medications Given: Viscous lidocaine  Prednisone   Upon re-evaluation, patient remains well-appearing with stable vitals.  She was given apple juice to drink and was able to swallow this without difficulty.  Her testing for flu, COVID, and RSV is negative.  Strep test negative.  Exam and testing most consistent with viral pharyngitis.  However, we will send off strep culture since she states symptoms have been going on for about 1 week. No evidence of emergent pathology such as peritonsillar abscess, ludwig angina.  No signs of dehydration, mucous membranes moist and cap refill under 2 seconds. She reports mild productive cough. No cough on exam.  Lungs clear to auscultation, no fevers, doubt pneumonia. No indication for chest x-ray at this time.  Stable and appropriate for discharge home    Impression: Viral pharyngitis Viral URI  Disposition:  Discharged home with instructions to use over-the-counter medications as needed for symptom control.  Prednisone  as prescribed.  Viscous lidocaine  as needed for throat pain.  Follow-up with PCP if symptoms not improving within the next 5 days. Return precautions given and patient verbalized understanding.    This chart was dictated using voice recognition software, Dragon. Despite the best efforts of this provider to proofread and correct errors,  errors may still occur which can change documentation meaning.       Final diagnoses:  Viral pharyngitis  Viral URI with cough    ED Discharge Orders          Ordered    predniSONE  (DELTASONE ) 10 MG tablet  Q breakfast        07/23/24 1024    lidocaine  (XYLOCAINE ) 2 % solution  Every 4 hours PRN        07/23/24 1024               Veta Palma, PA-C 07/23/24 1047  "

## 2024-07-23 NOTE — ED Triage Notes (Signed)
 Pt c/o thick white vaginal discharge and vaginal irritation for 2 days.

## 2024-07-23 NOTE — Discharge Instructions (Addendum)
 You have an upper respiratory infection (URI). An upper respiratory tract infection is a viral infection of the air passages leading to the lungs. Your cough and sore throat should improve gradually after 7-10 days. You may have a lingering cough that lasts for 2- 4 weeks after the infection.  Your illness is contagious and can be spread to others. It cannot be cured by antibiotics or other medicines. Take basic precautions such as washing your hands often, covering your mouth when you cough or sneeze, and avoiding public places where you could spread your illness to others.   Your flu, covid, and RSV test were negative today.  Your strep test is negative.  As discussed, we have sent off a strep culture, and if this is positive, you will be contacted and will need treatment with antibiotics.  Home care instructions:  You may take up to 1000mg  of tylenol  every 6 hours as needed for pain.  Do not take more then 4g per day.  For sore throat: try warm salt water gargles, cepacol lozenges, throat spray, warm tea or water with lemon/honey, popsicles or ice, or OTC cold relief medicine for throat discomfort.  You may use the viscous lidocaine  prescribed to help with throat pain as needed.  You have been prescribed prednisone . Please take this medication as prescribed for the next 7 days (40mg  on days 1 and 2, 30mg  on days 3 and 4, 20mg  on days 5 and 6, 10mg  on day 7).  You were given your first dose here today.  Take your next dose tomorrow morning.  Take this medication in the morning with breakfast, as taking it at night may make it hard to sleep. If you are a diabetic, please monitor your blood sugars closely on this medication, as it can cause your blood sugar to rise.     For cough: honey 1/2 to 1 teaspoon (you can dilute the honey in water or another fluid).  You can also use guaifenesin and dextromethorphan for cough which are over-the-counter medications. You can use a humidifier for chest congestion  and cough.  If you don't have a humidifier, you can sit in the bathroom with the hot shower running.      For congestion: Flonase (Fluticasone) 1-2 sprays in each nostril daily. This is an over the counter medication.    It is important to stay hydrated: drink plenty of fluids (water, gatorade/powerade/pedialyte, juices, or teas) to keep your throat moisturized and help further relieve irritation/discomfort.   Follow-up instructions: Please follow-up with your primary care provider for further evaluation of your symptoms if you are not feeling better within the next 5 days.   Return instructions:  Please return to the Emergency Department if you experience worsening symptoms.  RETURN IMMEDIATELY IF you develop shortness of breath, confusion or altered mental status, a new rash, become dizzy, faint, or poorly responsive, or are unable to be cared for at home. Please return if you have persistent vomiting and cannot keep down fluids or develop a fever that is not controlled by tylenol  or motrin .   Please return if you have any other emergent concerns.

## 2024-07-23 NOTE — ED Provider Notes (Signed)
 " [facility]    CSN: [id number] Arrival date & time: 07/23/24  1256      History   Chief Complaint Chief Complaint  Patient presents with   Vaginal Itching    Vaginal discomfort, little discharge - Entered by patient    HPI Morgan Mcguire is a 18 y.o. female.   18 year old female presents urgent care with complaints of vaginal discharge and irritation.  This started about 2 days ago.  The discharge is [place] Slimp and thick.  There is a little bit of itching.  The area is more irritated than itchy.  She denies any abnormal vaginal bleeding.  She denies any dysuria or hematuria, abdominal pain.  She denies any fevers, nausea, vomiting.  She would like to have screening for STI.   Vaginal Itching Pertinent negatives include no chest pain, no abdominal pain and no shortness of breath.    Past Medical History:  Diagnosis Date   Acute ear infection     Patient Active Problem List   Diagnosis Date Noted   Epigastric pain 06/03/2023    Past Surgical History:  Procedure Laterality Date   WISDOM TOOTH EXTRACTION      OB History   No obstetric history on file.      Home Medications    Prior to Admission medications  Medication Sig Start Date End Date Taking? Authorizing Provider  fluconazole  (DIFLUCAN ) 150 MG tablet Take 1 tablet (150 mg total) by mouth daily for 2 days. take 1 tablet now and then repeat in 3 days 07/23/24 07/25/24 Yes Dalonte Hardage A, PA-C  lidocaine  (XYLOCAINE ) 2 % solution Use as directed 15 mLs in the mouth or throat every 4 (four) hours as needed for mouth pain (Throat pain). 07/23/24   Veta Palma, PA-C  pantoprazole  (PROTONIX ) 20 MG tablet Take 1 tablet (20 mg total) by mouth daily. 02/16/24   Charlanne Groom, MD  predniSONE  (DELTASONE ) 10 MG tablet Take 4 tablets (40 mg total) by mouth daily with breakfast for 1 day, THEN 3 tablets (30 mg total) daily with breakfast for 2 days, THEN 2 tablets (20 mg total) daily with breakfast for 2  days, THEN 1 tablet (10 mg total) daily with breakfast for 1 day. 07/23/24 07/29/24  Veta Palma, PA-C  tretinoin (RETIN-A) 0.025 % cream Apply 1 Application topically at bedtime. 11/24/23   [provider]    Family History Family History  Problem Relation Age of Onset   Liver disease Neg Hx    Esophageal cancer Neg Hx    Colon cancer Neg Hx    Rectal cancer Neg Hx    Stomach cancer Neg Hx     Social History Social History[1]   Allergies   Ibuprofen  and Lactose intolerance (gi)   Review of Systems Review of Systems  Constitutional:  Negative for chills and fever.  HENT:  Negative for ear pain and sore throat.   Eyes:  Negative for pain and visual disturbance.  Respiratory:  Negative for cough and shortness of breath.   Cardiovascular:  Negative for chest pain and palpitations.  Gastrointestinal:  Negative for abdominal pain and vomiting.  Genitourinary:  Positive for vaginal discharge. Negative for dysuria and hematuria.  Musculoskeletal:  Negative for arthralgias and back pain.  Skin:  Negative for color change and rash.  Neurological:  Negative for seizures and syncope.  All other systems reviewed and are negative.    Physical Exam Triage Vital Signs ED Triage Vitals  Encounter Vitals Group  BP 07/23/24 1318 131/87     Girls Systolic BP Percentile --      Girls Diastolic BP Percentile --      Boys Systolic BP Percentile --      Boys Diastolic BP Percentile --      Pulse Rate 07/23/24 1318 68     Resp 07/23/24 1318 17     Temp 07/23/24 1318 98.2 F (36.8 C)     Temp Source 07/23/24 1318 Oral     SpO2 07/23/24 1318 98 %     Weight --      Height --      Head Circumference --      Peak Flow --      Pain Score 07/23/24 1340 0     Pain Loc --      Pain Education --      Exclude from Growth Chart --    No data found.  Updated Vital Signs BP 131/87 (BP Location: Right Arm)   Pulse 68   Temp 98.2 F (36.8 C) (Oral)   Resp 17   LMP  06/30/2024   SpO2 98%   Visual Acuity Right Eye Distance:   Left Eye Distance:   Bilateral Distance:    Right Eye Near:   Left Eye Near:    Bilateral Near:     Physical Exam Vitals and nursing note reviewed.  Constitutional:      General: She is not in acute distress.    Appearance: She is well-developed.  HENT:     Head: Normocephalic and atraumatic.  Eyes:     Conjunctiva/sclera: Conjunctivae normal.  Cardiovascular:     Rate and Rhythm: Normal rate and regular rhythm.     Heart sounds: No murmur heard. Pulmonary:     Effort: Pulmonary effort is normal. No respiratory distress.     Breath sounds: Normal breath sounds.  Abdominal:     Palpations: Abdomen is soft.     Tenderness: There is no abdominal tenderness.  Musculoskeletal:        General: No swelling.     Cervical back: Neck supple.  Skin:    General: Skin is warm and dry.     Capillary Refill: Capillary refill takes less than 2 seconds.  Neurological:     Mental Status: She is alert.  Psychiatric:        Mood and Affect: Mood normal.      UC Treatments / Results  Labs (all labs ordered are listed, but only abnormal results are displayed) Labs Reviewed  POCT URINE DIPSTICK - Abnormal; Notable for the following components:      Result Value   pH, UA 8.5 (*)    All other components within normal limits  POCT URINE PREGNANCY - Normal    EKG   Radiology No results found.  Procedures Procedures (including critical care time)  Medications Ordered in UC Medications - No data to display  Initial Impression / Assessment and Plan / UC Course  I have reviewed the triage vital signs and the nursing notes.  Pertinent labs & imaging results that were available during my care of the patient were reviewed by me and considered in my medical decision making (see chart for details).     Vaginal discharge  Screening examination for STI   Symptoms and physical exam findings are most consistent with a  vaginal yeast infection.  We will go ahead and call and Diflucan  150 mg take 1 tablet now and repeat in  3 days.  Screening swab done today and results will be available in 24-48 hours. We will contact you if we need to arrange additional treatment based on your testing. Negative results will be on your MyChart account. Abstain from sex until you receive your final results.  Use a condom for sexual encounters. If you have any worsening or changing symptoms including abnormal discharge, pelvic pain, abdominal pain, fever, nausea, or vomiting, then you should be reevaluated.    Final Clinical Impressions(s) / UC Diagnoses   Final diagnoses:  Vaginal discharge  Screening examination for STI     Discharge Instructions      Symptoms and physical exam findings are most consistent with a vaginal yeast infection.  We will go ahead and call and Diflucan  150 mg take 1 tablet now and repeat in 3 days.  Screening swab done today and results will be available in 24-48 hours. We will contact you if we need to arrange additional treatment based on your testing. Negative results will be on your MyChart account. Abstain from sex until you receive your final results.  Use a condom for sexual encounters. If you have any worsening or changing symptoms including abnormal discharge, pelvic pain, abdominal pain, fever, nausea, or vomiting, then you should be reevaluated.      ED Prescriptions     Medication Sig Dispense Auth. Provider   fluconazole  (DIFLUCAN ) 150 MG tablet Take 1 tablet (150 mg total) by mouth daily for 2 days. take 1 tablet now and then repeat in 3 days 2 tablet Teresa Almarie LABOR, PA-C      PDMP not reviewed this encounter.    [1]  Social History Tobacco Use   Smoking status: Never   Smokeless tobacco: Never  Vaping Use   Vaping status: Never Used  Substance Use Topics   Alcohol use: Never   Drug use: Never     Teresa Almarie LABOR, PA-C 07/23/24 1400  "

## 2024-07-23 NOTE — Discharge Instructions (Addendum)
 Symptoms and physical exam findings are most consistent with a vaginal yeast infection.  We will go ahead and call and Diflucan  150 mg take 1 tablet now and repeat in 3 days.  Screening swab done today and results will be available in 24-48 hours. We will contact you if we need to arrange additional treatment based on your testing. Negative results will be on your MyChart account. Abstain from sex until you receive your final results.  Use a condom for sexual encounters. If you have any worsening or changing symptoms including abnormal discharge, pelvic pain, abdominal pain, fever, nausea, or vomiting, then you should be reevaluated.

## 2024-07-23 NOTE — ED Notes (Signed)
 Drinking po fluid without difficulty

## 2024-07-23 NOTE — ED Triage Notes (Signed)
 Congestion, body aches, headache for 1 week. sore throat started yesterday, states difficult/painful to swallow. Denies difficulty breathing.  Pt not talking in triage, mother talking for pt.  No obvious obstruction noted to throat

## 2024-07-24 ENCOUNTER — Ambulatory Visit (HOSPITAL_COMMUNITY): Payer: Self-pay

## 2024-07-24 LAB — CERVICOVAGINAL ANCILLARY ONLY
Bacterial Vaginitis (gardnerella): NEGATIVE
Candida Glabrata: POSITIVE — AB
Candida Vaginitis: POSITIVE — AB
Chlamydia: NEGATIVE
Comment: NEGATIVE
Comment: NEGATIVE
Comment: NEGATIVE
Comment: NEGATIVE
Comment: NEGATIVE
Comment: NORMAL
Neisseria Gonorrhea: NEGATIVE
Trichomonas: NEGATIVE

## 2024-07-25 LAB — CULTURE, GROUP A STREP (THRC)

## 2024-07-26 ENCOUNTER — Telehealth (HOSPITAL_BASED_OUTPATIENT_CLINIC_OR_DEPARTMENT_OTHER): Payer: Self-pay | Admitting: *Deleted

## 2024-07-26 NOTE — Telephone Encounter (Signed)
 Post ED Visit - Positive Culture Follow-up  Culture report reviewed by antimicrobial stewardship pharmacist: [facility] []  Rankin Dee, Pharm.D. []  Venetia Gully, Pharm.D., BCPS AQ-ID []  Garrel Crews, Pharm.D., BCPS []  Almarie Lunger, [address].D., BCPS []  [place], [address].D., BCPS, AAHIVP []  Rosaline Bihari, Pharm.D., BCPS, AAHIVP []  Vernell Meier, PharmD, BCPS []  Latanya Hint, PharmD, BCPS []  Donald Medley, PharmD, BCPS []  Rocky Bold, PharmD []  Dorothyann Alert, PharmD, BCPS []  Morene Babe, PharmD  [facility] []  Rosaline Edison, PharmD []  Romona Bliss, PharmD []  Dolphus Roller, PharmD []  Veva Seip, Rph []  Vernell Daunt) Leonce, PharmD []  Eva Allis, PharmD []  Rosaline Millet, PharmD []  Iantha Batch, PharmD []  Arvin Gauss, PharmD []  Wanda Hasting, PharmD []  Ronal Rav, PharmD []  Rocky Slade, PharmD []  Bard Jeans, PharmD   Positive urine culture No urinary symptoms, no further patient follow-up is required at this time.  Morgan Mcguire 07/26/2024, 10:13 AM

## 2024-08-12 ENCOUNTER — Ambulatory Visit
Admission: EM | Admit: 2024-08-12 | Discharge: 2024-08-12 | Disposition: A | Discharge status: Home / Self Care | Attending: Physician Assistant | Admitting: Physician Assistant

## 2024-08-12 ENCOUNTER — Other Ambulatory Visit: Payer: Self-pay

## 2024-08-12 DIAGNOSIS — L292 Pruritus vulvae: Secondary | ICD-10-CM | POA: Insufficient documentation

## 2024-08-12 DIAGNOSIS — R350 Frequency of micturition: Secondary | ICD-10-CM | POA: Diagnosis not present

## 2024-08-12 DIAGNOSIS — N898 Other specified noninflammatory disorders of vagina: Secondary | ICD-10-CM | POA: Diagnosis present

## 2024-08-12 DIAGNOSIS — R3915 Urgency of urination: Secondary | ICD-10-CM | POA: Insufficient documentation

## 2024-08-12 LAB — POCT URINE DIPSTICK
Bilirubin, UA: NEGATIVE
Blood, UA: NEGATIVE
Glucose, UA: NEGATIVE mg/dL
Ketones, POC UA: NEGATIVE mg/dL
Nitrite, UA: NEGATIVE
POC PROTEIN,UA: NEGATIVE
Spec Grav, UA: 1.02
Urobilinogen, UA: 0.2 U/dL
pH, UA: 7

## 2024-08-12 LAB — POCT URINE PREGNANCY: Preg Test, Ur: NEGATIVE

## 2024-08-12 MED ORDER — NYSTATIN 100000 UNIT/GM EX CREA: TOPICAL_CREAM | CUTANEOUS | 0 refills | Status: AC

## 2024-08-12 MED ORDER — FLUCONAZOLE 150 MG PO TABS: 150.0000 mg | ORAL_TABLET | ORAL | 0 refills | Status: AC | PRN

## 2024-08-12 NOTE — ED Provider Notes (Signed)
 " [facility]    CSN: [id number] Arrival date & time: 08/12/24  1402      History   Chief Complaint Chief Complaint  Patient presents with   Vaginal Discharge    HPI Morgan Mcguire is a 19 y.o. female.   HPI  Pt is here today with concerns for vaginal discharge changes, vulvovaginal itching and discomfort that has been ongoing for about 4 days.  She noticed a small scratch in the genital area when she was itching but denies rashes or sores in the area.    Past Medical History:  Diagnosis Date   Acute ear infection     Patient Active Problem List   Diagnosis Date Noted   Epigastric pain 06/03/2023    Past Surgical History:  Procedure Laterality Date   WISDOM TOOTH EXTRACTION      OB History   No obstetric history on file.      Home Medications    Prior to Admission medications  Medication Sig Start Date End Date Taking? Authorizing Provider  dicyclomine (BENTYL) 10 MG capsule Take 10 mg by mouth. 08/04/24  Yes [provider]  fluconazole  (DIFLUCAN ) 150 MG tablet Take 1 tablet (150 mg total) by mouth every three (3) days as needed. May repeat in 3 days if symptoms not resolved 08/12/24  Yes Lizbet Cirrincione E, PA-C  nystatin  cream (MYCOSTATIN ) Apply to affected area 2 times daily 08/12/24  Yes Mai Longnecker E, PA-C  lidocaine  (XYLOCAINE ) 2 % solution Use as directed 15 mLs in the mouth or throat every 4 (four) hours as needed for mouth pain (Throat pain). 07/23/24   Veta Palma, PA-C  pantoprazole  (PROTONIX ) 20 MG tablet Take 1 tablet (20 mg total) by mouth daily. 02/16/24   Charlanne Groom, MD  tretinoin (RETIN-A) 0.025 % cream Apply 1 Application topically at bedtime. 11/24/23   [provider]    Family History Family History  Problem Relation Age of Onset   Liver disease Neg Hx    Esophageal cancer Neg Hx    Colon cancer Neg Hx    Rectal cancer Neg Hx    Stomach cancer Neg Hx     Social History Social  History[1]   Allergies   Ibuprofen  and Lactose intolerance (gi)   Review of Systems Review of Systems  Constitutional:  Negative for chills and fever.  Genitourinary:  Positive for vaginal discharge. Negative for difficulty urinating, dysuria, flank pain, frequency, genital sores, vaginal bleeding and vaginal pain.  Skin:  Negative for rash.     Physical Exam Triage Vital Signs ED Triage Vitals  Encounter Vitals Group     BP 08/12/24 1423 108/67     Girls Systolic BP Percentile --      Girls Diastolic BP Percentile --      Boys Systolic BP Percentile --      Boys Diastolic BP Percentile --      Pulse Rate 08/12/24 1423 63     Resp 08/12/24 1423 17     Temp 08/12/24 1423 98.2 F (36.8 C)     Temp Source 08/12/24 1423 Oral     SpO2 08/12/24 1423 99 %     Weight --      Height --      Head Circumference --      Peak Flow --      Pain Score 08/12/24 1421 0     Pain Loc --      Pain Education --  Exclude from Growth Chart --    No data found.  Updated Vital Signs BP 108/67 (BP Location: Right Arm)   Pulse 63   Temp 98.2 F (36.8 C) (Oral)   Resp 17   LMP 07/29/2024 (Exact Date)   SpO2 99%   Visual Acuity Right Eye Distance:   Left Eye Distance:   Bilateral Distance:    Right Eye Near:   Left Eye Near:    Bilateral Near:     Physical Exam Vitals reviewed.  Constitutional:      General: She is awake.     Appearance: Normal appearance. She is well-developed and well-groomed.  HENT:     Head: Normocephalic and atraumatic.  Eyes:     General: Lids are normal. Gaze aligned appropriately.     Extraocular Movements: Extraocular movements intact.     Conjunctiva/sclera: Conjunctivae normal.  Pulmonary:     Effort: Pulmonary effort is normal.  Neurological:     Mental Status: She is alert and oriented to person, place, and time.  Psychiatric:        Attention and Perception: Attention and perception normal.        Mood and Affect: Mood and affect  normal.        Speech: Speech normal.        Behavior: Behavior normal. Behavior is cooperative.      UC Treatments / Results  Labs (all labs ordered are listed, but only abnormal results are displayed) Labs Reviewed  POCT URINE DIPSTICK - Abnormal; Notable for the following components:      Result Value   Leukocytes, UA Small (1+) (*)    All other components within normal limits  URINE CULTURE  POCT URINE PREGNANCY  CERVICOVAGINAL ANCILLARY ONLY    EKG   Radiology No results found.  Procedures Procedures (including critical care time)  Medications Ordered in UC Medications - No data to display  Initial Impression / Assessment and Plan / UC Course  I have reviewed the triage vital signs and the nursing notes.  Pertinent labs & imaging results that were available during my care of the patient were reviewed by me and considered in my medical decision making (see chart for details).      Final Clinical Impressions(s) / UC Diagnoses   Final diagnoses:  Vaginal discharge   Patient is here today with concerns of vaginal discharge changes along with vulvovaginal itching has been ongoing for about 4 days.  Urine pregnancy test negative.  Urine dip is notable for trace leukocytes so we will send urine culture off for definitive rule out of UTI.  Cervicovaginal swab collected to assess for BV, yeast, trichomoniasis, gonorrhea, chlamydia.  Results to dictate further management.  Patient symptoms appear consistent with a yeast infection so we will send Diflucan  and nystatin  to assist with symptoms while awaiting results.  Follow-up as needed/indicated by testing    Discharge Instructions      You are seen today for concerns of vulvovaginal discomfort and vaginal discharge changes.  Your symptoms appear consistent with a likely yeast infection so we have collected a cervicovaginal swab to assess for BV, yeast, trichomonas, gonorrhea and chlamydia.  Your urine was notable for  signs of white blood cells which can be consistent with a UTI.  Since you are not having pain with urination I suspect this might be contamination from your vulvovaginal concern so I am sending a sample of your urine off for urine culture for definitive rule out of  a UTI or infection.  We will keep you updated with those results once they are available  We will keep you updated on the results of your cervicovaginal swab once the results are available.  If any other medication or treatment is indicated by those results that will be sent into the pharmacy that we have on file. Please make sure that you are practicing safe sex and using barrier methods to prevent exposure. It is recommended to avoid intercourse until you have the results back from testing and have completed any treatments that are sent in for you.   To assist with your symptoms while we are waiting on your test results I have sent in a medication called Diflucan  for you to take once every 72 hours.  This medication treats yeast infections fairly well and has relatively low side effect burden.  To assist with any external irritation I am sending in a cream called nystatin .  This medication helps to treat yeast infections of the skin.  Please do not use this inside the vagina as it can cause further irritation.      ED Prescriptions     Medication Sig Dispense Auth. Provider   fluconazole  (DIFLUCAN ) 150 MG tablet Take 1 tablet (150 mg total) by mouth every three (3) days as needed. May repeat in 3 days if symptoms not resolved 2 tablet Morgan Supak E, PA-C   nystatin  cream (MYCOSTATIN ) Apply to affected area 2 times daily 30 g Morgan Ericsson E, PA-C      PDMP not reviewed this encounter.     [1]  Social History Tobacco Use   Smoking status: Never   Smokeless tobacco: Never  Vaping Use   Vaping status: Never Used  Substance Use Topics   Alcohol use: Never   Drug use: Never     Argenis Kumari, Rocky BRAVO, PA-C 08/12/24 1552  "

## 2024-08-12 NOTE — Discharge Instructions (Signed)
 You are seen today for concerns of vulvovaginal discomfort and vaginal discharge changes.  Your symptoms appear consistent with a likely yeast infection so we have collected a cervicovaginal swab to assess for BV, yeast, trichomonas, gonorrhea and chlamydia.  Your urine was notable for signs of white blood cells which can be consistent with a UTI.  Since you are not having pain with urination I suspect this might be contamination from your vulvovaginal concern so I am sending a sample of your urine off for urine culture for definitive rule out of a UTI or infection.  We will keep you updated with those results once they are available  We will keep you updated on the results of your cervicovaginal swab once the results are available.  If any other medication or treatment is indicated by those results that will be sent into the pharmacy that we have on file. Please make sure that you are practicing safe sex and using barrier methods to prevent exposure. It is recommended to avoid intercourse until you have the results back from testing and have completed any treatments that are sent in for you.   To assist with your symptoms while we are waiting on your test results I have sent in a medication called Diflucan  for you to take once every 72 hours.  This medication treats yeast infections fairly well and has relatively low side effect burden.  To assist with any external irritation I am sending in a cream called nystatin .  This medication helps to treat yeast infections of the skin.  Please do not use this inside the vagina as it can cause further irritation.

## 2024-08-12 NOTE — ED Triage Notes (Signed)
 Pt presents with a chief complaint of vaginal discharge x 4 days. Describes discharge as cottage-cheese like. Does endorse itching. Denies unusual odors + urinary symptoms. No pain. One dose of Diflucan  + two tablets of leftover Flagyl  taken for sxs with no improvement/relief.

## 2024-08-13 ENCOUNTER — Ambulatory Visit

## 2024-08-14 ENCOUNTER — Ambulatory Visit (HOSPITAL_COMMUNITY): Payer: Self-pay

## 2024-08-14 LAB — URINE CULTURE: Culture: NO GROWTH

## 2024-08-14 LAB — CERVICOVAGINAL ANCILLARY ONLY
Bacterial Vaginitis (gardnerella): NEGATIVE
Candida Glabrata: POSITIVE — AB
Candida Vaginitis: POSITIVE — AB
Chlamydia: NEGATIVE
Comment: NEGATIVE
Comment: NEGATIVE
Comment: NEGATIVE
Comment: NEGATIVE
Comment: NEGATIVE
Comment: NORMAL
Neisseria Gonorrhea: NEGATIVE
Trichomonas: NEGATIVE

## 2024-08-18 ENCOUNTER — Ambulatory Visit: Admitting: Family Medicine

## 2024-08-18 ENCOUNTER — Encounter: Payer: Self-pay | Admitting: Family Medicine

## 2024-08-18 VITALS — BP 130/76 | HR 92 | Temp 99.1°F | Resp 18 | Ht 68.0 in | Wt 175.4 lb

## 2024-08-18 DIAGNOSIS — M25561 Pain in right knee: Secondary | ICD-10-CM

## 2024-08-18 NOTE — Progress Notes (Signed)
 "  Subjective:    Patient ID: Morgan Mcguire, female    DOB: 08-07-2005, 19 y.o.   MRN: [id number]  No chief complaint on file.   HPI Patient is in today for leg pain and needs a new letter of accomodation.  Discussed the use of AI scribe software for clinical note transcription with the patient, who gave verbal consent to proceed.  History of Present Illness Morgan Mcguire is a 19 year old female who presents with an updated school note and ongoing knee pain.  She experiences intermittent pain in her right knee, particularly when walking. As a kinesiology major, she is currently participating in a school program where she trains with a coach, which provides some relief. She recalls previously seeing Odis Mace, a sports medicine doctor, in [place], [place] for this issue.  She has been visiting urgent care frequently due to vaginal discharge and suspects she has a urinary tract infection (UTI). She visited her school health department today and was prescribed Pyridium and a sulfa drug. She was previously diagnosed with a yeast infection. She is sexually active, sometimes uses condoms, and is not on birth control.     Past Medical History:  Diagnosis Date   Acute ear infection     Past Surgical History:  Procedure Laterality Date   WISDOM TOOTH EXTRACTION      Family History  Problem Relation Age of Onset   Liver disease Neg Hx    Esophageal cancer Neg Hx    Colon cancer Neg Hx    Rectal cancer Neg Hx    Stomach cancer Neg Hx     Social History   Socioeconomic History   Marital status: Single    Spouse name: Not on file   Number of children: Not on file   Years of education: Not on file   Highest education level: Not on file  Occupational History   Occupation: front desk reception    Comment: [employer]   Occupation: student a and [employer]  Tobacco Use   Smoking status: Never   Smokeless tobacco: Never  Vaping Use   Vaping status: Never Used  Substance  and Sexual Activity   Alcohol use: Never   Drug use: Never   Sexual activity: Yes    Partners: Male    Birth control/protection: Condom  Other Topics Concern   Not on file  Social History Narrative   Lives at home with mom   No pets    House    Social Drivers of Health   Tobacco Use: Low Risk (08/18/2024)   Patient History    Smoking Tobacco Use: Never    Smokeless Tobacco Use: Never    Passive Exposure: Not on file  Financial Resource Strain: Patient Declined (08/24/2023)   Overall Financial Resource Strain (CARDIA)    Difficulty of Paying Living Expenses: Patient declined  Food Insecurity: Patient Declined (08/24/2023)   Hunger Vital Sign    Worried About Running Out of Food in the Last Year: Patient declined    Ran Out of Food in the Last Year: Patient declined  Transportation Needs: No Transportation Needs (08/24/2023)   PRAPARE - Administrator, Civil Service (Medical): No    Lack of Transportation (Non-Medical): No  Physical Activity: Not on file  Stress: No Stress Concern Present (08/24/2023)   [employer] of Occupational Health - Occupational Stress Questionnaire    Feeling of Stress : Not at all  Social Connections: Unknown (08/24/2023)  Social Connection and Isolation Panel    Frequency of Communication with Friends and Family: More than three times a week    Frequency of Social Gatherings with Friends and Family: More than three times a week    Attends Religious Services: More than 4 times per year    Active Member of [employer] or Organizations: Yes    Attends Banker Meetings: More than 4 times per year    Marital Status: Patient declined  Intimate Partner Violence: Not on file  Depression (PHQ2-9): Low Risk (08/18/2024)   Depression (PHQ2-9)    PHQ-2 Score: 0  Alcohol Screen: Not on file  Housing: Not on file  Utilities: Not on file  Health Literacy: Not on file    Outpatient Medications Prior to Visit  Medication Sig Dispense  Refill   dicyclomine (BENTYL) 10 MG capsule Take 10 mg by mouth.     fluconazole  (DIFLUCAN ) 150 MG tablet Take 1 tablet (150 mg total) by mouth every three (3) days as needed. May repeat in 3 days if symptoms not resolved 2 tablet 0   lidocaine  (XYLOCAINE ) 2 % solution Use as directed 15 mLs in the mouth or throat every 4 (four) hours as needed for mouth pain (Throat pain). 200 mL 0   nystatin  cream (MYCOSTATIN ) Apply to affected area 2 times daily 30 g 0   pantoprazole  (PROTONIX ) 20 MG tablet Take 1 tablet (20 mg total) by mouth daily. 30 tablet 6   tretinoin (RETIN-A) 0.025 % cream Apply 1 Application topically at bedtime.     No facility-administered medications prior to visit.    Allergies[1]  Review of Systems  Constitutional:  Negative for fever and malaise/fatigue.  HENT:  Negative for congestion.   Eyes:  Negative for blurred vision.  Respiratory:  Negative for cough and shortness of breath.   Cardiovascular:  Negative for chest pain, palpitations and leg swelling.  Gastrointestinal:  Negative for vomiting.  Musculoskeletal:  Negative for back pain.       No change in pain r knee   Skin:  Negative for rash.  Neurological:  Negative for loss of consciousness and headaches.       Objective:    Physical Exam Vitals and nursing note reviewed.  Constitutional:      General: She is not in acute distress.    Appearance: Normal appearance. She is well-developed.  HENT:     Head: Normocephalic and atraumatic.  Eyes:     General: No scleral icterus.       Right eye: No discharge.        Left eye: No discharge.  Cardiovascular:     Rate and Rhythm: Normal rate and regular rhythm.     Heart sounds: No murmur heard. Pulmonary:     Effort: Pulmonary effort is normal. No respiratory distress.     Breath sounds: Normal breath sounds.  Musculoskeletal:        General: Swelling and tenderness present. Normal range of motion.     Cervical back: Normal range of motion and neck  supple.     Right knee: Swelling present. Tenderness present over the medial joint line and lateral joint line.     Right lower leg: No edema.     Left lower leg: No edema.  Skin:    General: Skin is warm and dry.  Neurological:     Mental Status: She is alert and oriented to person, place, and time.  Psychiatric:  Mood and Affect: Mood normal.        Behavior: Behavior normal.        Thought Content: Thought content normal.        Judgment: Judgment normal.     BP 130/76 (BP Location: Right Arm, Patient Position: Sitting, Cuff Size: Normal)   Pulse 92   Temp 99.1 F (37.3 C) (Oral)   Resp 18   Ht 5' 8 (1.727 m)   Wt 175 lb 6.4 oz (79.6 kg)   LMP 07/29/2024 (Exact Date)   SpO2 99%   BMI 26.67 kg/m  Wt Readings from Last 3 Encounters:  08/18/24 175 lb 6.4 oz (79.6 kg)  06/13/24 173 lb 3.2 oz (78.6 kg)  06/12/24 175 lb (79.4 kg)    Diabetic Foot Exam - Simple   No data filed    Lab Results  Component Value Date   WBC 7.0 02/10/2024   HGB 12.7 02/10/2024   HCT 39.1 02/10/2024   PLT 183 02/10/2024   GLUCOSE 81 02/10/2024   ALT 23 11/11/2023   AST 31 11/11/2023   NA 140 02/10/2024   K 4.3 02/10/2024   CL 106 02/10/2024   CREATININE 0.80 02/10/2024   BUN 8 02/10/2024   CO2 23 02/10/2024   TSH 1.33 11/11/2023    Lab Results  Component Value Date   TSH 1.33 11/11/2023   Lab Results  Component Value Date   WBC 7.0 02/10/2024   HGB 12.7 02/10/2024   HCT 39.1 02/10/2024   MCV 83.7 02/10/2024   PLT 183 02/10/2024   Lab Results  Component Value Date   NA 140 02/10/2024   K 4.3 02/10/2024   CO2 23 02/10/2024   GLUCOSE 81 02/10/2024   BUN 8 02/10/2024   CREATININE 0.80 02/10/2024   BILITOT 0.3 11/11/2023   ALKPHOS 48 11/11/2023   AST 31 11/11/2023   ALT 23 11/11/2023   PROT 7.6 11/11/2023   ALBUMIN 4.5 11/11/2023   CALCIUM 9.2 02/10/2024   ANIONGAP 12 02/10/2024   GFR 90.19 11/11/2023   No results found for: CHOL No results found for:  HDL No results found for: LDLCALC No results found for: TRIG No results found for: CHOLHDL No results found for: YHAJ8R     Assessment & Plan:  Right knee pain, unspecified chronicity -     Ambulatory referral to Sports Medicine  Assessment and Plan Assessment & Plan Right knee pain   Intermittent right knee pain worsens with walking, especially in cold weather. Participating in a school training program offers some relief. No constant pain is reported. Previously consulted with Dr. Leonce, a sports medicine specialist in [place]. Refer to an orthopedic or sports medicine specialist for further evaluation. Consider physical therapy as a treatment option.    Morgan Underberg R Lowne Chase, DO     [1]  Allergies Allergen Reactions   Ibuprofen  Other (See Comments)    GI upset    Lactose Intolerance (Gi)    "
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
## 2024-08-23 ENCOUNTER — Ambulatory Visit: Admitting: Sports Medicine

## 2024-08-23 VITALS — BP 120/82 | Ht 68.0 in | Wt 175.0 lb

## 2024-08-23 DIAGNOSIS — M222X1 Patellofemoral disorders, right knee: Secondary | ICD-10-CM

## 2024-08-23 DIAGNOSIS — M25561 Pain in right knee: Secondary | ICD-10-CM

## 2024-08-23 DIAGNOSIS — G8929 Other chronic pain: Secondary | ICD-10-CM

## 2024-08-23 MED ORDER — MELOXICAM 15 MG PO TABS: 15.0000 mg | ORAL_TABLET | Freq: Every day | ORAL | 0 refills | Status: AC | PRN

## 2024-08-23 NOTE — Patient Instructions (Addendum)
-   Use meloxicam  15 mg daily as needed for breakthrough pain.  Recommend limiting chronic NSAIDs to 1-2 doses per week to prevent long-term side effects. Use Tylenol  500 to 1000 mg tablets 2-3 times a day as needed for day-to-day pain relief.    Meloxicam  refill   Knee HEP   MRI  Pt referral   Call to follow up 1 week after MRI to discuss results

## 2024-09-13 ENCOUNTER — Ambulatory Visit: Admitting: Physical Therapy
